# Patient Record
Sex: Female | Born: 1955 | Race: White | Hispanic: No | Smoking: Current some day smoker
Health system: Southern US, Community
[De-identification: ages and names within clinical notes are randomized; demographics above are authoritative.]

## PROBLEM LIST (undated history)

## (undated) DIAGNOSIS — E785 Hyperlipidemia, unspecified: Secondary | ICD-10-CM

## (undated) DIAGNOSIS — F32A Depression, unspecified: Secondary | ICD-10-CM

## (undated) DIAGNOSIS — F419 Anxiety disorder, unspecified: Secondary | ICD-10-CM

## (undated) DIAGNOSIS — F329 Major depressive disorder, single episode, unspecified: Secondary | ICD-10-CM

## (undated) DIAGNOSIS — G4700 Insomnia, unspecified: Secondary | ICD-10-CM

## (undated) DIAGNOSIS — R928 Other abnormal and inconclusive findings on diagnostic imaging of breast: Secondary | ICD-10-CM

## (undated) DIAGNOSIS — G5602 Carpal tunnel syndrome, left upper limb: Secondary | ICD-10-CM

## (undated) DIAGNOSIS — I10 Essential (primary) hypertension: Secondary | ICD-10-CM

## (undated) DIAGNOSIS — Z720 Tobacco use: Secondary | ICD-10-CM

## (undated) HISTORY — DX: Hyperlipidemia, unspecified: E78.5

## (undated) HISTORY — DX: Tobacco use: Z72.0

## (undated) HISTORY — DX: Carpal tunnel syndrome, left upper limb: G56.02

## (undated) HISTORY — DX: Major depressive disorder, single episode, unspecified: F32.9

## (undated) HISTORY — DX: Anxiety disorder, unspecified: F41.9

## (undated) HISTORY — DX: Other abnormal and inconclusive findings on diagnostic imaging of breast: R92.8

## (undated) HISTORY — DX: Insomnia, unspecified: G47.00

## (undated) HISTORY — PX: ABDOMINAL HYSTERECTOMY: SHX81

## (undated) HISTORY — DX: Depression, unspecified: F32.A

## (undated) HISTORY — DX: Essential (primary) hypertension: I10

---

## 2000-02-06 ENCOUNTER — Other Ambulatory Visit: Admission: RE | Admit: 2000-02-06 | Discharge: 2000-02-06 | Payer: Self-pay | Admitting: Obstetrics and Gynecology

## 2000-02-06 ENCOUNTER — Encounter (INDEPENDENT_AMBULATORY_CARE_PROVIDER_SITE_OTHER): Payer: Self-pay | Admitting: Specialist

## 2000-02-10 ENCOUNTER — Ambulatory Visit (HOSPITAL_COMMUNITY): Admission: RE | Admit: 2000-02-10 | Discharge: 2000-02-10 | Payer: Self-pay | Admitting: Obstetrics and Gynecology

## 2000-02-10 ENCOUNTER — Encounter: Payer: Self-pay | Admitting: Obstetrics and Gynecology

## 2000-03-06 ENCOUNTER — Ambulatory Visit: Admission: RE | Admit: 2000-03-06 | Discharge: 2000-03-06 | Payer: Self-pay | Admitting: Gynecology

## 2000-10-09 ENCOUNTER — Encounter: Admission: RE | Admit: 2000-10-09 | Discharge: 2000-10-09 | Payer: Self-pay | Admitting: Obstetrics & Gynecology

## 2000-10-11 ENCOUNTER — Encounter: Admission: RE | Admit: 2000-10-11 | Discharge: 2000-10-11 | Payer: Self-pay | Admitting: Internal Medicine

## 2000-10-18 ENCOUNTER — Encounter: Admission: RE | Admit: 2000-10-18 | Discharge: 2000-10-18 | Payer: Self-pay | Admitting: Internal Medicine

## 2000-11-15 ENCOUNTER — Encounter: Admission: RE | Admit: 2000-11-15 | Discharge: 2000-11-15 | Payer: Self-pay | Admitting: Obstetrics

## 2001-02-19 ENCOUNTER — Encounter (INDEPENDENT_AMBULATORY_CARE_PROVIDER_SITE_OTHER): Payer: Self-pay | Admitting: Specialist

## 2001-02-19 ENCOUNTER — Encounter: Admission: RE | Admit: 2001-02-19 | Discharge: 2001-02-19 | Payer: Self-pay | Admitting: Obstetrics & Gynecology

## 2001-02-27 ENCOUNTER — Ambulatory Visit (HOSPITAL_COMMUNITY): Admission: RE | Admit: 2001-02-27 | Discharge: 2001-02-27 | Payer: Self-pay

## 2001-03-04 ENCOUNTER — Encounter: Admission: RE | Admit: 2001-03-04 | Discharge: 2001-03-04 | Payer: Self-pay | Admitting: Internal Medicine

## 2001-03-29 ENCOUNTER — Encounter: Admission: RE | Admit: 2001-03-29 | Discharge: 2001-03-29 | Payer: Self-pay | Admitting: Obstetrics & Gynecology

## 2001-04-09 ENCOUNTER — Encounter: Admission: RE | Admit: 2001-04-09 | Discharge: 2001-04-09 | Payer: Self-pay | Admitting: Obstetrics & Gynecology

## 2001-04-09 ENCOUNTER — Other Ambulatory Visit: Admission: RE | Admit: 2001-04-09 | Discharge: 2001-04-09 | Payer: Self-pay | Admitting: *Deleted

## 2001-06-05 HISTORY — PX: OTHER SURGICAL HISTORY: SHX169

## 2001-07-30 ENCOUNTER — Encounter: Admission: RE | Admit: 2001-07-30 | Discharge: 2001-07-30 | Payer: Self-pay | Admitting: Internal Medicine

## 2001-08-13 ENCOUNTER — Encounter: Admission: RE | Admit: 2001-08-13 | Discharge: 2001-08-13 | Payer: Self-pay | Admitting: *Deleted

## 2001-08-29 ENCOUNTER — Encounter (INDEPENDENT_AMBULATORY_CARE_PROVIDER_SITE_OTHER): Payer: Self-pay

## 2001-08-29 ENCOUNTER — Inpatient Hospital Stay (HOSPITAL_COMMUNITY): Admission: RE | Admit: 2001-08-29 | Discharge: 2001-09-01 | Payer: Self-pay | Admitting: Obstetrics and Gynecology

## 2001-08-29 ENCOUNTER — Encounter (INDEPENDENT_AMBULATORY_CARE_PROVIDER_SITE_OTHER): Payer: Self-pay | Admitting: Specialist

## 2001-09-05 ENCOUNTER — Encounter: Admission: RE | Admit: 2001-09-05 | Discharge: 2001-09-05 | Payer: Self-pay | Admitting: *Deleted

## 2001-10-08 ENCOUNTER — Encounter: Admission: RE | Admit: 2001-10-08 | Discharge: 2001-10-08 | Payer: Self-pay | Admitting: *Deleted

## 2001-11-25 ENCOUNTER — Encounter: Admission: RE | Admit: 2001-11-25 | Discharge: 2001-11-25 | Payer: Self-pay | Admitting: Internal Medicine

## 2001-12-23 ENCOUNTER — Encounter: Admission: RE | Admit: 2001-12-23 | Discharge: 2001-12-23 | Payer: Self-pay | Admitting: Internal Medicine

## 2002-04-04 ENCOUNTER — Encounter: Admission: RE | Admit: 2002-04-04 | Discharge: 2002-04-04 | Payer: Self-pay | Admitting: Internal Medicine

## 2002-05-22 ENCOUNTER — Encounter: Admission: RE | Admit: 2002-05-22 | Discharge: 2002-05-22 | Payer: Self-pay | Admitting: Obstetrics and Gynecology

## 2002-06-26 ENCOUNTER — Encounter: Admission: RE | Admit: 2002-06-26 | Discharge: 2002-06-26 | Payer: Self-pay | Admitting: Internal Medicine

## 2003-06-25 ENCOUNTER — Encounter: Admission: RE | Admit: 2003-06-25 | Discharge: 2003-06-25 | Payer: Self-pay | Admitting: Obstetrics and Gynecology

## 2003-08-10 ENCOUNTER — Ambulatory Visit (HOSPITAL_COMMUNITY): Admission: RE | Admit: 2003-08-10 | Discharge: 2003-08-10 | Payer: Self-pay | Admitting: Obstetrics and Gynecology

## 2003-08-17 ENCOUNTER — Encounter: Admission: RE | Admit: 2003-08-17 | Discharge: 2003-08-17 | Payer: Self-pay | Admitting: Internal Medicine

## 2003-08-25 ENCOUNTER — Encounter: Admission: RE | Admit: 2003-08-25 | Discharge: 2003-08-25 | Payer: Self-pay | Admitting: Obstetrics and Gynecology

## 2003-08-25 ENCOUNTER — Encounter (INDEPENDENT_AMBULATORY_CARE_PROVIDER_SITE_OTHER): Payer: Self-pay | Admitting: Internal Medicine

## 2005-04-21 ENCOUNTER — Ambulatory Visit: Payer: Self-pay | Admitting: Internal Medicine

## 2006-04-24 ENCOUNTER — Ambulatory Visit: Payer: Self-pay | Admitting: Hospitalist

## 2006-04-24 ENCOUNTER — Encounter (INDEPENDENT_AMBULATORY_CARE_PROVIDER_SITE_OTHER): Payer: Self-pay | Admitting: Internal Medicine

## 2006-04-24 LAB — CONVERTED CEMR LAB
ALT: 20 units/L (ref 0–35)
AST: 16 units/L (ref 0–37)
BUN: 14 mg/dL (ref 6–23)
Cholesterol: 269 mg/dL — ABNORMAL HIGH (ref 0–200)
Glucose, Bld: 94 mg/dL (ref 70–99)
HCT: 43.6 % — ABNORMAL HIGH (ref 34.4–43.3)
Hemoglobin: 14.6 g/dL (ref 11.7–14.8)
MCHC: 33.5 g/dL (ref 33.1–35.4)
Platelets: 374 10*3/uL (ref 152–374)
RBC: 4.51 M/uL (ref 3.79–4.96)
Sodium: 138 meq/L (ref 135–145)
VLDL: 46 mg/dL — ABNORMAL HIGH (ref 0–40)
WBC: 7.8 10*3/uL (ref 3.7–10.0)

## 2006-05-07 DIAGNOSIS — F411 Generalized anxiety disorder: Secondary | ICD-10-CM | POA: Insufficient documentation

## 2006-05-07 DIAGNOSIS — F418 Other specified anxiety disorders: Secondary | ICD-10-CM | POA: Insufficient documentation

## 2006-05-07 DIAGNOSIS — Z720 Tobacco use: Secondary | ICD-10-CM | POA: Insufficient documentation

## 2006-05-07 DIAGNOSIS — G4700 Insomnia, unspecified: Secondary | ICD-10-CM | POA: Insufficient documentation

## 2006-05-07 DIAGNOSIS — M545 Low back pain, unspecified: Secondary | ICD-10-CM | POA: Insufficient documentation

## 2006-05-07 DIAGNOSIS — Z9079 Acquired absence of other genital organ(s): Secondary | ICD-10-CM | POA: Insufficient documentation

## 2006-05-07 DIAGNOSIS — I10 Essential (primary) hypertension: Secondary | ICD-10-CM | POA: Insufficient documentation

## 2006-05-25 ENCOUNTER — Encounter (INDEPENDENT_AMBULATORY_CARE_PROVIDER_SITE_OTHER): Payer: Self-pay | Admitting: Internal Medicine

## 2006-10-15 ENCOUNTER — Telehealth: Payer: Self-pay | Admitting: *Deleted

## 2006-11-16 ENCOUNTER — Telehealth: Payer: Self-pay | Admitting: *Deleted

## 2007-01-22 ENCOUNTER — Telehealth: Payer: Self-pay | Admitting: *Deleted

## 2007-02-18 ENCOUNTER — Ambulatory Visit: Payer: Self-pay | Admitting: Internal Medicine

## 2007-02-18 ENCOUNTER — Encounter (INDEPENDENT_AMBULATORY_CARE_PROVIDER_SITE_OTHER): Payer: Self-pay | Admitting: Internal Medicine

## 2007-02-19 LAB — CONVERTED CEMR LAB
ALT: 21 units/L (ref 0–35)
AST: 17 units/L (ref 0–37)
Albumin: 4.8 g/dL (ref 3.5–5.2)
Alkaline Phosphatase: 87 units/L (ref 39–117)
BUN: 15 mg/dL (ref 6–23)
Calcium: 9.9 mg/dL (ref 8.4–10.5)
Chloride: 101 meq/L (ref 96–112)
Cholesterol: 235 mg/dL — ABNORMAL HIGH (ref 0–200)
HDL: 40 mg/dL (ref >39)
LDL Cholesterol: 149 mg/dL — ABNORMAL HIGH (ref 0–99)
Potassium: 4.3 meq/L (ref 3.5–5.3)
Total Bilirubin: 0.4 mg/dL (ref 0.3–1.2)
Total CHOL/HDL Ratio: 5.9
Total Protein: 7.5 g/dL (ref 6.0–8.3)
Triglycerides: 232 mg/dL — ABNORMAL HIGH (ref <150)
VLDL: 46 mg/dL — ABNORMAL HIGH (ref 0–40)

## 2007-06-24 ENCOUNTER — Telehealth (INDEPENDENT_AMBULATORY_CARE_PROVIDER_SITE_OTHER): Payer: Self-pay | Admitting: Internal Medicine

## 2007-08-26 ENCOUNTER — Telehealth (INDEPENDENT_AMBULATORY_CARE_PROVIDER_SITE_OTHER): Payer: Self-pay | Admitting: Internal Medicine

## 2007-11-05 ENCOUNTER — Telehealth (INDEPENDENT_AMBULATORY_CARE_PROVIDER_SITE_OTHER): Payer: Self-pay | Admitting: Internal Medicine

## 2007-11-11 ENCOUNTER — Telehealth (INDEPENDENT_AMBULATORY_CARE_PROVIDER_SITE_OTHER): Payer: Self-pay | Admitting: Internal Medicine

## 2008-02-04 ENCOUNTER — Telehealth (INDEPENDENT_AMBULATORY_CARE_PROVIDER_SITE_OTHER): Payer: Self-pay | Admitting: Internal Medicine

## 2008-06-23 ENCOUNTER — Ambulatory Visit: Payer: Self-pay | Admitting: Infectious Disease

## 2008-06-23 ENCOUNTER — Encounter: Payer: Self-pay | Admitting: Internal Medicine

## 2008-06-23 ENCOUNTER — Encounter (INDEPENDENT_AMBULATORY_CARE_PROVIDER_SITE_OTHER): Payer: Self-pay | Admitting: Internal Medicine

## 2008-06-23 DIAGNOSIS — H6690 Otitis media, unspecified, unspecified ear: Secondary | ICD-10-CM | POA: Insufficient documentation

## 2008-06-23 DIAGNOSIS — E785 Hyperlipidemia, unspecified: Secondary | ICD-10-CM

## 2008-06-23 DIAGNOSIS — H60339 Swimmer's ear, unspecified ear: Secondary | ICD-10-CM | POA: Insufficient documentation

## 2008-06-23 HISTORY — DX: Hyperlipidemia, unspecified: E78.5

## 2008-06-27 DIAGNOSIS — R7401 Elevation of levels of liver transaminase levels: Secondary | ICD-10-CM | POA: Insufficient documentation

## 2008-06-27 DIAGNOSIS — R740 Nonspecific elevation of levels of transaminase and lactic acid dehydrogenase [LDH]: Secondary | ICD-10-CM

## 2008-06-27 LAB — CONVERTED CEMR LAB
Albumin: 4.4 g/dL (ref 3.5–5.2)
Calcium: 10.3 mg/dL (ref 8.4–10.5)
Chloride: 101 meq/L (ref 96–112)
Total Bilirubin: 0.3 mg/dL (ref 0.3–1.2)
Total CHOL/HDL Ratio: 7.5

## 2008-07-02 ENCOUNTER — Ambulatory Visit: Payer: Self-pay | Admitting: Internal Medicine

## 2009-01-12 ENCOUNTER — Telehealth (INDEPENDENT_AMBULATORY_CARE_PROVIDER_SITE_OTHER): Payer: Self-pay | Admitting: *Deleted

## 2009-03-15 ENCOUNTER — Ambulatory Visit: Payer: Self-pay | Admitting: Internal Medicine

## 2009-03-22 ENCOUNTER — Telehealth: Payer: Self-pay | Admitting: Internal Medicine

## 2009-03-22 LAB — CONVERTED CEMR LAB
ALT: 53 units/L — ABNORMAL HIGH (ref 0–35)
AST: 35 units/L (ref 0–37)
Albumin: 5 g/dL (ref 3.5–5.2)
Alkaline Phosphatase: 113 units/L (ref 39–117)
BUN: 13 mg/dL (ref 6–23)
Calcium: 10.4 mg/dL (ref 8.4–10.5)
Cholesterol: 253 mg/dL — ABNORMAL HIGH (ref 0–200)
Creatinine, Ser: 0.84 mg/dL (ref 0.40–1.20)
Sodium: 139 meq/L (ref 135–145)
Total Protein: 8 g/dL (ref 6.0–8.3)

## 2009-04-28 ENCOUNTER — Telehealth: Payer: Self-pay | Admitting: Internal Medicine

## 2009-04-28 ENCOUNTER — Ambulatory Visit (HOSPITAL_COMMUNITY): Admission: RE | Admit: 2009-04-28 | Discharge: 2009-04-28 | Payer: Self-pay | Admitting: Internal Medicine

## 2009-04-28 ENCOUNTER — Ambulatory Visit: Payer: Self-pay | Admitting: Infectious Disease

## 2009-05-03 LAB — CONVERTED CEMR LAB
Cholesterol: 272 mg/dL — ABNORMAL HIGH (ref 0–200)
HCV Ab: NEGATIVE
HDL: 37 mg/dL — ABNORMAL LOW (ref >39)
Hep A Total Ab: NEGATIVE
Total CHOL/HDL Ratio: 7.4

## 2009-05-05 DIAGNOSIS — R928 Other abnormal and inconclusive findings on diagnostic imaging of breast: Secondary | ICD-10-CM

## 2009-05-05 HISTORY — DX: Other abnormal and inconclusive findings on diagnostic imaging of breast: R92.8

## 2009-05-06 DIAGNOSIS — R928 Other abnormal and inconclusive findings on diagnostic imaging of breast: Secondary | ICD-10-CM | POA: Insufficient documentation

## 2009-05-12 ENCOUNTER — Encounter: Admission: RE | Admit: 2009-05-12 | Discharge: 2009-05-12 | Payer: Self-pay | Admitting: Internal Medicine

## 2009-07-27 ENCOUNTER — Telehealth: Payer: Self-pay | Admitting: Internal Medicine

## 2009-08-25 ENCOUNTER — Telehealth: Payer: Self-pay | Admitting: Internal Medicine

## 2009-11-02 ENCOUNTER — Telehealth: Payer: Self-pay | Admitting: Internal Medicine

## 2009-11-03 ENCOUNTER — Telehealth: Payer: Self-pay | Admitting: Internal Medicine

## 2009-11-17 ENCOUNTER — Ambulatory Visit: Payer: Self-pay | Admitting: Internal Medicine

## 2009-12-08 ENCOUNTER — Telehealth: Payer: Self-pay | Admitting: Internal Medicine

## 2010-06-02 ENCOUNTER — Telehealth: Payer: Self-pay | Admitting: Internal Medicine

## 2010-06-25 ENCOUNTER — Encounter: Payer: Self-pay | Admitting: *Deleted

## 2010-07-05 NOTE — Progress Notes (Signed)
Summary: refill/gg  Phone Note Refill Request  on December 08, 2009 10:51 AM  Refills Requested: Medication #1:  HYDROCHLOROTHIAZIDE 25 MG TABS Take 1 tablet by mouth once a day   Last Refilled: 11/03/2009  Method Requested: Electronic Initial call taken by: Merrie Roof RN,  December 08, 2009 10:51 AM  Follow-up for Phone Call        Rx faxed to pharmacy Follow-up by: Mariea Stable MD,  December 08, 2009 12:34 PM    Prescriptions: HYDROCHLOROTHIAZIDE 25 MG TABS (HYDROCHLOROTHIAZIDE) Take 1 tablet by mouth once a day  #30 x 6   Entered and Authorized by:   Mariea Stable MD   Signed by:   Mariea Stable MD on 12/08/2009   Method used:   Electronically to        [facility] [id number] (retail)       [address]       [place], [place]  [address]       Ph: [phone]       Fax: [phone]   RxID:   [id number]

## 2010-07-05 NOTE — Progress Notes (Signed)
Summary: refill/gg  Phone Note Refill Request  on August 25, 2009 11:49 AM  Refills Requested: Medication #1:  FLUOXETINE HCL 20 MG TABS Take 1 tablet by mouth once a day   Last Refilled: 07/23/2009  Method Requested: Electronic Initial call taken by: Merrie Roof RN,  August 25, 2009 11:49 AM  Follow-up for Phone Call        Rx faxed to pharmacy Follow-up by: Mariea Stable MD,  August 25, 2009 4:54 PM    Prescriptions: FLUOXETINE HCL 20 MG TABS (FLUOXETINE HCL) Take 1 tablet by mouth once a day  #30 x 3   Entered and Authorized by:   Mariea Stable MD   Signed by:   Mariea Stable MD on 08/25/2009   Method used:   Electronically to        [facility] [id number] (retail)       [address]       [place], [place]  [address]       Ph: [phone]       Fax: [phone]   RxID:   [id number]

## 2010-07-05 NOTE — Progress Notes (Signed)
Summary: refill/ hla  Phone Note Refill Request Message from:  Fax from Pharmacy on November 03, 2009 11:43 AM  Refills Requested: Medication #1:  HYDROCHLOROTHIAZIDE 25 MG TABS Take 1 tablet by mouth once a day   Dosage confirmed as above?Dosage Confirmed pt has appt 6/15  Initial call taken by: Marin Roberts RN,  November 03, 2009 11:43 AM  Follow-up for Phone Call        Rx faxed to pharmacy Follow-up by: Mariea Stable MD,  November 03, 2009 12:35 PM    Prescriptions: HYDROCHLOROTHIAZIDE 25 MG TABS (HYDROCHLOROTHIAZIDE) Take 1 tablet by mouth once a day  #30 x 0   Entered and Authorized by:   Mariea Stable MD   Signed by:   Mariea Stable MD on 11/03/2009   Method used:   Electronically to        [facility] [id number] (retail)       [address]       [place], [place]  [address]       Ph: [phone]       Fax: [phone]   RxID:   [id number]

## 2010-07-05 NOTE — Progress Notes (Signed)
Summary: med refill/gp  Phone Note Refill Request Message from:  Fax from Pharmacy on July 27, 2009 3:32 PM  Refills Requested: Medication #1:  PRAVACHOL 40 MG TABS Take 1 tab by mouth at bedtime   Last Refilled: 06/20/2009  Medication #2:  FENOFIBRATE 54 MG TABS Take 1 tablet by mouth once a day.   Last Refilled: 06/20/2009  Method Requested: Electronic Initial call taken by: Chinita Pester RN,  July 27, 2009 3:32 PM  Follow-up for Phone Call        Pt should have appointment coming up soon to f/u on lipids and LFTs  Rx faxed to pharmacy Follow-up by: Mariea Stable MD,  July 28, 2009 9:02 AM    Prescriptions: FENOFIBRATE 54 MG TABS (FENOFIBRATE) Take 1 tablet by mouth once a day  #30 x 2   Entered and Authorized by:   Mariea Stable MD   Signed by:   Mariea Stable MD on 07/28/2009   Method used:   Electronically to        [facility] [id number] (retail)       [address]       [place], [place]  [address]       Ph: [phone]       Fax: [phone]   RxID:   [id number] PRAVACHOL 40 MG TABS (PRAVASTATIN SODIUM) Take 1 tab by mouth at bedtime  #30 x prn   Entered and Authorized by:   Mariea Stable MD   Signed by:   Mariea Stable MD on 07/28/2009   Method used:   Electronically to        [facility] [id number] (retail)       [address]       [place], [place]  [address]       Ph: [phone]       Fax: [phone]   RxID:   [id number]   Appended Document: med refill/gp This patient has been given an appointment and mailed an appointment for 09/02/2009 with Dr. Onalee Hua.

## 2010-07-05 NOTE — Assessment & Plan Note (Signed)
Summary: est-ck/fu/meds/cfb   Vital Signs:  Patient profile:   55 year old female Height:      61.5 inches (156.21 cm) Weight:      165.7 pounds (76.82 kg) BMI:     31.53 Temp:     97.4 degrees F (36.33 degrees C) oral Pulse rate:   49 / minute BP sitting:   150 / 87  (left arm) Cuff size:   regular  Vitals Entered By: Theotis Barrio NT II (November 17, 2009 8:44 AM) CC: ROUTINE OFFICE VISIT WITH MEDICATION REFILL  /  NO CONCERNS NOR COMPLAINTS Is Patient Diabetic? No Pain Assessment Patient in pain? no      Nutritional Status BMI of 25 - 29 = overweight  Have you ever been in a relationship where you felt threatened, hurt or afraid?No   Does patient need assistance? Functional Status Self care Comments ROUTINE OFFICE VISIT WITH MEDICATION REFILL  /  NOR CONCERNS NOR COMPLAINTS   Primary Care Provider:  Carlus Pavlov MD  CC:  ROUTINE OFFICE VISIT WITH MEDICATION REFILL  /  NO CONCERNS NOR COMPLAINTS.  History of Present Illness: Kelsey Hudson is a 55 yo woman with PMH as outlined below.  She is here for routine visit.  She has no complaints aside from large medical bills recieved from prior visits and labs ordered.  She remains uninsured and had a card from [facility], but she is living with a friend and with his income added she does not qualify anymore.      Preventive Screening-Counseling & Management  Alcohol-Tobacco     Smoking Status: current     Smoking Cessation Counseling: yes     Packs/Day: 1     Year Started: for 30+ years  Caffeine-Diet-Exercise     Does Patient Exercise: no  Current Medications (verified): 1)  Atenolol 25 Mg Tabs (Atenolol) .... Take 1 Tablet By Mouth Once A Day 2)  Hydrochlorothiazide 25 Mg Tabs (Hydrochlorothiazide) .... Take 1 Tablet By Mouth Once A Day 3)  Calcium-Vitamin D 500-200 Mg-Unit Tabs (Calcium Carbonate-Vitamin D) .... Take 1 Tablet By Mouth Once A Day 4)  Pravachol 40 Mg Tabs (Pravastatin Sodium) .... Take 1 Tab By Mouth  At Bedtime 5)  Fluoxetine Hcl 20 Mg Tabs (Fluoxetine Hcl) .... Take 1 Tablet By Mouth Once A Day 6)  Fenofibrate 54 Mg Tabs (Fenofibrate) .... Take 1 Tablet By Mouth Once A Day  Allergies (verified): No Known Drug Allergies  Past History:  Past Medical History: Last updated: 05/07/2006 Current Problems:  HYPERTENSION (ICD-401.9) HYPERTRIGLYCERIDEMIA (ICD-272.1) DEPRESSION (ICD-311) ANXIETY (ICD-300.00) INSOMNIA (ICD-780.52) TOBACCO ABUSE (ICD-305.1) HYSTERECTOMY, HX OF (ICD-V45.77) LOW BACK PAIN (ICD-724.2)  Past Surgical History: Last updated: 05/07/2006 Hysterectomy- 2003  Risk Factors: Smoking Status: current (11/17/2009) Packs/Day: 1 (11/17/2009)  Social History: Does Patient Exercise:  no  Review of Systems      See HPI  Physical Exam  General:  alert, well-nourished, and overweight-appearing.   Eyes:  anicteric Lungs:  normal respiratory effort, no accessory muscle use, normal breath sounds, no crackles, and no wheezes.   Heart:  normal rate, regular rhythm, no murmur, no gallop, and no rub.   Abdomen:  normal bowel sounds.   Extremities:  no edema Neurologic:  alert & oriented X3, cranial nerves II-XII intact, strength normal in all extremities, and gait normal.   Psych:  Oriented X3, memory intact for recent and remote, normally interactive, good eye contact, not anxious appearing, and not depressed appearing.  Impression & Recommendations:  Problem # 1:  OTHER ABNORMAL FINDING RADIOLOGICAL EXAM BREAST (ICD-793.89) compression views:  prob benign calcifications.....Marland Kitchen6 mo f/u recommended...Marland Kitchennow due but pt refuses due to cost....discussed and understands implications of not getting it done.  Problem # 2:  TRANSAMINASES, SERUM, ELEVATED (ICD-790.4) Likely fatty infiltration...Marland KitchenMarland Kitchenrefuses further w/u at this time due to outstanding bills  Problem # 3:  HYPERTENSION (ICD-401.9)  has been on atenolol for long time....Marland Kitchennot sure why it was chosen...Marland KitchenMarland KitchenHR 49  today and BP slightly elevated will d/c atenolol and start amlodipine 5mg  ($10/100 at [facility])....this will not require monitoring of K and Cr  The following medications were removed from the medication list:    Atenolol 25 Mg Tabs (Atenolol) .Marland Kitchen... Take 1 tablet by mouth once a day Her updated medication list for this problem includes:    Hydrochlorothiazide 25 Mg Tabs (Hydrochlorothiazide) .Marland Kitchen... Take 1 tablet by mouth once a day    Amlodipine Besylate 5 Mg Tabs (Amlodipine besylate) .Marland Kitchen... Take 1 tablet by mouth once a day  BP today: 150/87 Prior BP: 155/84 (03/15/2009)  Labs Reviewed: K+: 4.4 (03/15/2009) Creat: : 0.84 (03/15/2009)   Chol: 272 (04/28/2009)   HDL: 37 (04/28/2009)   LDL: * mg/dL (16/03/9603)   TG: 540 (04/28/2009)  Problem # 4:  HYPERLIPIDEMIA (ICD-272.4) started fenofibrate last visit pt refuses further tests due to outstanding balance.  Her updated medication list for this problem includes:    Pravachol 40 Mg Tabs (Pravastatin sodium) .Marland Kitchen... Take 1 tab by mouth at bedtime    Fenofibrate 54 Mg Tabs (Fenofibrate) .Marland Kitchen... Take 1 tablet by mouth once a day  Labs Reviewed: SGOT: 35 (03/15/2009)   SGPT: 53 (03/15/2009)   HDL:37 (04/28/2009), 36 (03/15/2009)  LDL:* mg/dL (98/04/9146), NOT CALC mg/dL (82/95/6213)  YQMV:784 (04/28/2009), 253 (03/15/2009)  Trig:479 (04/28/2009), 487 (03/15/2009)  Problem # 5:  DEPRESSION (ICD-311) She does not feel much different Is sleeping better Reports that her boyfriend has definitely noticed a positive change will increase to 40mg   Her updated medication list for this problem includes:    Fluoxetine Hcl 40 Mg Caps (Fluoxetine hcl) .Marland Kitchen... Take 1 tablet by mouth once a day  Complete Medication List: 1)  Hydrochlorothiazide 25 Mg Tabs (Hydrochlorothiazide) .... Take 1 tablet by mouth once a day 2)  Calcium-vitamin D 500-200 Mg-unit Tabs (Calcium carbonate-vitamin d) .... Take 1 tablet by mouth once a day 3)  Pravachol 40 Mg Tabs  (Pravastatin sodium) .... Take 1 tab by mouth at bedtime 4)  Fluoxetine Hcl 40 Mg Caps (Fluoxetine hcl) .... Take 1 tablet by mouth once a day 5)  Fenofibrate 54 Mg Tabs (Fenofibrate) .... Take 1 tablet by mouth once a day 6)  Amlodipine Besylate 5 Mg Tabs (Amlodipine besylate) .... Take 1 tablet by mouth once a day  Patient Instructions: 1)  Please schedule a follow-up appointment in 3 months. 2)  See Jaynee Eagles to discuss bills. 3)  Will increase fluoxetine to 40mg  (take 2 of the 20mg  tablets until finished), then pick up new prescription ($4). 4)  Will stop atenolol 5)  Start amlodipine 5mg  by mouth daily, it is $10 for 100 pills (3 month supply) at [facility] [phone] 6)  If you have any problems before your next visit, call clinic.  Prescriptions: AMLODIPINE BESYLATE 5 MG TABS (AMLODIPINE BESYLATE) Take 1 tablet by mouth once a day  #100 x 3   Entered and Authorized by:   Mariea Stable MD   Signed by:   Mariea Stable MD  on 11/17/2009   Method used:   Electronically to        [facility]* (retail)       [address]       [place], [place]  [address]       Ph: [phone]       Fax: [phone]   RxID:   [id number] FLUOXETINE HCL 40 MG CAPS (FLUOXETINE HCL) Take 1 tablet by mouth once a day  #30 x 3   Entered and Authorized by:   Mariea Stable MD   Signed by:   Mariea Stable MD on 11/17/2009   Method used:   Electronically to        [facility] [id number] (retail)       [address]       [place], [place]  [address]       Ph: [phone]       Fax: [phone]   RxID:   [id number]    Prevention & Chronic Care Immunizations   Influenza vaccine: Not documented   Influenza vaccine deferral: Refused  (03/15/2009)    Tetanus booster: Not documented   Td booster deferral: Refused  (03/15/2009)    Pneumococcal vaccine: Not documented  Colorectal Screening   Hemoccult: Not documented   Hemoccult action/deferral:  Refused  (03/15/2009)    Colonoscopy: Not documented   Colonoscopy action/deferral: Refused  (03/15/2009)  Other Screening   Pap smear: Not documented   Pap smear action/deferral: Not indicated S/P hysterectomy  (03/15/2009)    Mammogram: BI-RADS CATEGORY 3:  Probably benign finding(s) - short interval^MM DIGITAL DIAG LTD R  (05/12/2009)   Mammogram action/deferral: Ordered  (03/15/2009)   Smoking status: current  (11/17/2009)   Smoking cessation counseling: yes  (11/17/2009)  Lipids   Total Cholesterol: 272  (04/28/2009)   Lipid panel action/deferral: Lipid Panel ordered   LDL: * mg/dL  (55/73/2202)   LDL Direct: Not documented   HDL: 37  (04/28/2009)   Triglycerides: 479  (04/28/2009)    SGOT (AST): 35  (03/15/2009)   BMP action: Ordered   SGPT (ALT): 53  (03/15/2009)   Alkaline phosphatase: 113  (03/15/2009)   Total bilirubin: 0.5  (03/15/2009)    Lipid flowsheet reviewed?: Yes   Progress toward LDL goal: Unchanged   Lipid comments: pt refuses checking labs due to cost and outstanding bills  Hypertension   Last Blood Pressure: 150 / 87  (11/17/2009)   Serum creatinine: 0.84  (03/15/2009)   BMP action: Ordered   Serum potassium 4.4  (03/15/2009)    Hypertension flowsheet reviewed?: Yes   Progress toward BP goal: Unchanged  Self-Management Support :   Personal Goals (by the next clinic visit) :      Personal blood pressure goal: 140/90  (11/17/2009)     Personal LDL goal: 100  (11/17/2009)    Patient will work on the following items until the next clinic visit to reach self-care goals:     Medications and monitoring: take my medicines every day, bring all of my medications to every visit  (11/17/2009)     Eating: drink diet soda or water instead of juice or soda, eat more vegetables, use fresh or frozen vegetables, eat foods that are low in salt, eat baked foods instead of fried foods, eat fruit for snacks and desserts, limit or avoid alcohol  (11/17/2009)      Activity: take the stairs instead of the elevator, park at the far end of the  parking lot  (03/15/2009)    Hypertension self-management support: Resources for patients handout, Written self-care plan  (11/17/2009)   Hypertension self-care plan printed.    Lipid self-management support: Resources for patients handout, Written self-care plan  (11/17/2009)   Lipid self-care plan printed.    Self-management comments: WORK OUT IN THE YARD      Resource handout printed.

## 2010-07-05 NOTE — Progress Notes (Signed)
Summary: med refill/gp  Phone Note Refill Request Message from:  Fax from Pharmacy on Nov 02, 2009 2:16 PM  Refills Requested: Medication #1:  ATENOLOL 25 MG TABS Take 1 tablet by mouth once a day   Last Refilled: 09/28/2009 Last appt.Nov 2010.   Method Requested: Electronic Initial call taken by: Chinita Pester RN,  Nov 02, 2009 2:16 PM  Follow-up for Phone Call        Rx faxed to pharmacy Follow-up by: Mariea Stable MD,  November 03, 2009 8:04 AM    Prescriptions: ATENOLOL 25 MG TABS (ATENOLOL) Take 1 tablet by mouth once a day  #30 x 6   Entered and Authorized by:   Mariea Stable MD   Signed by:   Mariea Stable MD on 11/03/2009   Method used:   Electronically to        [facility] [id number] (retail)       [address]       [place], [place]  [address]       Ph: [phone]       Fax: [phone]   RxID:   [id number]

## 2010-07-07 NOTE — Progress Notes (Signed)
Summary: Refill/gh  Phone Note Refill Request Message from:  Fax from Pharmacy on June 02, 2010 10:03 AM  Refills Requested: Medication #1:  FENOFIBRATE 54 MG TABS Take 1 tablet by mouth once a day   Last Refilled: 04/26/2010 Last office visit was 11/17/2009.  Last labs were 04/28/2010.   Method Requested: Electronic Initial call taken by: Angelina Ok RN,  June 02, 2010 10:04 AM  Follow-up for Phone Call        Refill approved-nurse to complete Follow-up by: Julaine Fusi  DO,  June 02, 2010 1:56 PM    Prescriptions: FENOFIBRATE 54 MG TABS (FENOFIBRATE) Take 1 tablet by mouth once a day  #30 x 2   Entered and Authorized by:   Julaine Fusi  DO   Signed by:   Julaine Fusi  DO on 06/02/2010   Method used:   Electronically to        [facility] [id number] (retail)       [address]       [place], [place]  [address]       Ph: [phone]       Fax: [phone]   RxID:   [id number]

## 2010-08-23 ENCOUNTER — Other Ambulatory Visit: Payer: Self-pay | Admitting: *Deleted

## 2010-08-23 MED ORDER — HYDROCHLOROTHIAZIDE 25 MG PO TABS: 25.0000 mg | ORAL_TABLET | Freq: Every day | ORAL | Status: DC

## 2010-08-23 NOTE — Telephone Encounter (Signed)
Patient last seen in June 2011 but no recent labs.  Meds refilled but should have follow up with labs specified on reason for office visit.  Will route to front desk.

## 2010-09-13 ENCOUNTER — Encounter: Payer: Self-pay | Admitting: Internal Medicine

## 2010-09-13 ENCOUNTER — Ambulatory Visit (INDEPENDENT_AMBULATORY_CARE_PROVIDER_SITE_OTHER): Payer: Self-pay | Admitting: Internal Medicine

## 2010-09-13 VITALS — BP 157/87 | HR 66 | Temp 97.9°F | Ht 62.0 in | Wt 156.1 lb

## 2010-09-13 DIAGNOSIS — G5602 Carpal tunnel syndrome, left upper limb: Secondary | ICD-10-CM

## 2010-09-13 DIAGNOSIS — Z23 Encounter for immunization: Secondary | ICD-10-CM

## 2010-09-13 DIAGNOSIS — G5600 Carpal tunnel syndrome, unspecified upper limb: Secondary | ICD-10-CM

## 2010-09-13 DIAGNOSIS — F17200 Nicotine dependence, unspecified, uncomplicated: Secondary | ICD-10-CM

## 2010-09-13 DIAGNOSIS — E785 Hyperlipidemia, unspecified: Secondary | ICD-10-CM

## 2010-09-13 DIAGNOSIS — I10 Essential (primary) hypertension: Secondary | ICD-10-CM

## 2010-09-13 DIAGNOSIS — F329 Major depressive disorder, single episode, unspecified: Secondary | ICD-10-CM

## 2010-09-13 HISTORY — DX: Carpal tunnel syndrome, left upper limb: G56.02

## 2010-09-13 LAB — BASIC METABOLIC PANEL
CO2: 26 mEq/L (ref 19–32)
Chloride: 101 mEq/L (ref 96–112)
Glucose, Bld: 97 mg/dL (ref 70–99)
Potassium: 4 mEq/L (ref 3.5–5.3)
Sodium: 140 mEq/L (ref 135–145)

## 2010-09-13 MED ORDER — PRAVASTATIN SODIUM 40 MG PO TABS: 40.0000 mg | ORAL_TABLET | Freq: Every day | ORAL | Status: DC

## 2010-09-13 MED ORDER — HYDROCHLOROTHIAZIDE 25 MG PO TABS: 25.0000 mg | ORAL_TABLET | Freq: Every day | ORAL | Status: DC

## 2010-09-13 MED ORDER — AMLODIPINE BESYLATE 10 MG PO TABS: 10.0000 mg | ORAL_TABLET | Freq: Every day | ORAL | Status: DC

## 2010-09-13 MED ORDER — AMLODIPINE BESYLATE 5 MG PO TABS: 5.0000 mg | ORAL_TABLET | Freq: Every day | ORAL | Status: DC

## 2010-09-13 MED ORDER — FENOFIBRATE 54 MG PO TABS: 54.0000 mg | ORAL_TABLET | Freq: Every day | ORAL | Status: DC

## 2010-09-13 MED ORDER — CALCIUM CARBONATE-VITAMIN D 500-200 MG-UNIT PO TABS: 1.0000 | ORAL_TABLET | Freq: Every day | ORAL | Status: DC

## 2010-09-13 NOTE — Assessment & Plan Note (Signed)
Patient was given an appointment with social work for counseling purposes. Patient understands that smoking is the biggest risk factor right now for having heart problems. I spent 10-15 minutes in counseling about the ways and benefits of stopping smoking. Patient is not agreeable to try any new therapies at this time and wants to quit by herself.

## 2010-09-13 NOTE — Assessment & Plan Note (Signed)
As above.

## 2010-09-13 NOTE — Assessment & Plan Note (Signed)
I increased patient's Norvasc from 5 mg a day to 10 mg a day. Have asked the patient to cut back on her salt intake and also try and lose weight which would help her blood pressure as well as her carpal tunnel syndrome. I have asked her to come back to the clinic in 6 months with a diary of regular blood pressure readings.

## 2010-09-13 NOTE — Assessment & Plan Note (Signed)
Patient stopped taking fluoxetine by herself and states that it was not helping her. I asked her about her anxiety issues and she did not seem concerned at this point. I discussed the fact that it takes at least 1-2 months for the therapy to take effect. Patient is not interested in starting any new medications for anxiety. If this comes up as an issue in the future I would recommend starting an SSRI with a trial of at least 4-6 months before stopping it.

## 2010-09-13 NOTE — Assessment & Plan Note (Signed)
Patient was started on fenofibrate recently. I do not see a need to check another lipid profile today. Patient will continue on fenofibrate and Pravachol. Refills were provided today

## 2010-09-13 NOTE — Progress Notes (Signed)
  Subjective:    Patient ID: Kelsey Hudson, female    DOB: 03-03-1956, 55 y.o.   MRN: [id number]  HPI Ms. Hafner is a 55 year old woman patient of Dr. Onalee Hua who is here today for a yearly checkup.   Patient complains of pain in her wrist and numbness in her hand left more than right. She sees that she has been using her left wrist excessively due to her recent work job requirements. Patient says that she sleeps with her left wrist in folded. Patient denies any trauma, neck pain, recent weight loss, change in appetite, change in urinary habits, fatigue or change in sleeping habits.  Patient also complains that she has been having some dizziness last 2-3 weeks. Her blood pressure has always been a little high. Patient says that it's very difficult to explain exactly how she feels. Patient denies any feelings of palpitations, shortness of breath, chest pain. There are no exacerbating or relieving factors.  Patient is currently smoking about 1 pack a day. Patient is willing to stop smoking but wants to do it by herself she is agreeable to see social work for counseling purposes.  Patient has been compliant with her blood pressure medications.  Patient is due for a colonoscopy and tetanus shot. I would give her a tetanus shot today.   Review of Systems  Constitutional: Negative for fever, activity change and appetite change.  HENT: Negative for sore throat.   Respiratory: Negative for cough and shortness of breath.   Cardiovascular: Negative for chest pain and leg swelling.  Gastrointestinal: Negative for nausea, abdominal pain, diarrhea, constipation and abdominal distention.  Genitourinary: Negative for frequency, hematuria and difficulty urinating.  Neurological: Negative for dizziness and headaches.  Psychiatric/Behavioral: Negative for suicidal ideas and behavioral problems.       Objective:   Physical Exam  Constitutional: She is oriented to person, place, and time. She appears  well-developed and well-nourished.  HENT:  Head: Normocephalic and atraumatic.  Eyes: Conjunctivae and EOM are normal. Pupils are equal, round, and reactive to light. No scleral icterus.  Neck: Normal range of motion. Neck supple. No JVD present. No thyromegaly present.  Cardiovascular: Normal rate, regular rhythm, normal heart sounds and intact distal pulses.  Exam reveals no gallop and no friction rub.   No murmur heard. Pulmonary/Chest: Effort normal and breath sounds normal. No respiratory distress. She has no wheezes. She has no rales.  Abdominal: Soft. Bowel sounds are normal. She exhibits no distension and no mass. There is no tenderness. There is no rebound and no guarding.  Musculoskeletal: Normal range of motion. She exhibits no edema and no tenderness.       Tinel's sign negative.  Lymphadenopathy:    She has no cervical adenopathy.  Neurological: She is alert and oriented to person, place, and time.  Psychiatric: She has a normal mood and affect. Her behavior is normal.          Assessment & Plan:

## 2010-09-16 ENCOUNTER — Telehealth: Payer: Self-pay | Admitting: Licensed Clinical Social Worker

## 2010-09-20 NOTE — Telephone Encounter (Signed)
Sending [facility] information and my card to call when she is ready for counseling.

## 2010-09-20 NOTE — Telephone Encounter (Signed)
09/20/10  Left another message.

## 2010-10-21 NOTE — H&P (Signed)
[facility]  Patient:    Kelsey Hudson, Kelsey Hudson                       MRN: [id number] Adm. Date:  03/06/00 Attending:  Rande Brunt. Clarke-Pearson, M.D. CC:         Brook A. Edward Jolly, M.D.             Telford Nab, R.N.                         History and Physical  GYN ONCOLOGY PROGRAM  HISTORY:  The patient is a 55 year old white female referred by Dr. Conley Simmonds of consultation regarding management of an ovarian cyst.  The patient initially presented to Dr. Edward Jolly with abnormal uterine bleeding, whereupon an ultrasound was obtained.  This showed an endometrial stripe of 2.1 cm.  The patient also had a biopsy showing benign secretory endometrium.  She has been placed on cyclic Provera with control of her bleeding.  The ultrasound also showed bilateral complex cystic lesions in the adnexa, on the right measuring 7.4 x 6.5 x 7 cm and, on the left, 4.9 x 5 x 5.1 cm with multiple septations. No free fluid was noted.  Subsequently, the patient has had a CAT scan of the abdomen and pelvis showing complex masses in the pelvis with no evidence of free fluid.  The patients CA-125 value is 7.7.  Her other laboratory work is normal except for a platelet count of 428,000.  PAST MEDICAL HISTORY:  MEDICAL ILLNESSES:  None.  PAST SURGICAL HISTORY:   None.  DRUG ALLERGIES:  None.  CURRENT MEDICATIONS:  None.  FAMILY HISTORY:  The patients grandmother had breast cancer.  She has a sister with endometriosis.  REVIEW OF SYSTEMS:  No constitutional symptoms and no weight change.  The patient has a minimal amount of vaginal spotting at the present time.  she denies any other GI or GU symptoms.  She has no neurologic, cardiovascular or pulmonary symptoms.  PHYSICAL EXAMINATION:  VITAL SIGNS:  Height 5 feet 1 inch, weight 163 pounds, blood pressure 160/88, pulse 76, respirations 18.  GENERAL:  Healthy white female in no acute distress.  HEENT:  Negative.  NECK:   Supple without thyromegaly.  ABDOMEN:  Soft and nontender.  No mass, organomegaly, ascites or hernias are noted.  PELVIC:  EG/BUS is normal.  The vagina is clean.  The cervix is normal.  The uterus is anterior, normal shape, size and consistency.  There is a fullness throughout the pelvis, but I am unable to define a discrete mass.  IMPRESSION:  Bilateral ovarian cystic masses with thin septations.  No evidence of free fluid or nodularities with a normal CA-125.  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  The patient is in agreement with this plan.  I have contacted Dr. Conley Simmonds and indicated that I thought the risk  of ovarian cancer was extremely low, but that I would be happy to be on standby on the day of surgery to assist if it turned out that this was an ovarian cancer. DD:  03/06/00 TD:  03/06/00 Job: [id number]

## 2010-10-21 NOTE — Group Therapy Note (Signed)
NAME:  Kelsey Hudson, Kelsey Hudson                         ACCOUNT NO.:  [id number]   MEDICAL RECORD NO.:  [id number]                   PATIENT TYPE:  OUT   LOCATION:  [facility]                           FACILITY:  [facility]   PHYSICIAN:  Argentina Donovan, MD                     DATE OF BIRTH:  Feb 18, 1956   DATE OF SERVICE:  06/25/2003                                    CLINIC NOTE   REASON FOR VISIT:  The patient is a 55 year old white female 2 years post  total supracervical hysterectomy with bilateral salpingo-oophorectomy for  endometriosis.  She has been on estradiol 2 mg since that time, has been on  atenolol and hydrochlorothiazide for blood pressure and now is running a  very normal blood pressure 120/77.  She had a significant weight loss down  to 128 pounds and is otherwise in good health.  She, however, smokes 1 pack  a day and is not quite ready to stop that.  We thought that we could reduce  the amount of estrogen she is on.  I discussed the problems with estrogen as  well as the benefits and we are switching her to estradiol 0.5 mg daily.  The patient will also be getting her baseline mammogram.  She has never had  one.   PHYSICAL EXAMINATION:  NECK:  Thyroid has no nodules.  BREASTS:  Soft, symmetrical, and no nipple discharge and no dominant masses.  PELVIC:  A Pap smear was taken since the patient does have a remaining  cervix.   Her main complaint was vaginal dryness.  We discussed the use of a personal  lubricant by Oletta Darter as well as Astroglide.   DIAGNOSIS:  Routine gynecological annual examination with estrogen  supplementation which she has also been encouraged to take calcium  supplement.                                               Argentina Donovan, MD    PR/MEDQ  D:  06/25/2003  T:  06/26/2003  Job:  [id number]

## 2010-10-21 NOTE — Op Note (Signed)
[facility]  Patient:    Kelsey Hudson, Kelsey Hudson Visit Number: [id number] MRN: [id number]          Service Type: GYN Location: [id number] Attending Physician:  Amada Kingfisher. Dictated by:   Argentina Donovan, M.D. Proc. Date: 08/29/01 Admit Date:  08/29/2001 Discharge Date: 09/01/2001                             Operative Report  PROCEDURE:                    Subtotal abdominal hysterectomy, bilateral salpingo-oophorectomy, lysis of pelvic adhesions.  PREOPERATIVE DIAGNOSES:       Complex ovarian cysts.  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  OPERATIVE FINDINGS:           On entering the peritoneal cavity were moderate pelvic adhesions that plastered both of the adnexa including the ovary and the fallopian tubes to the lateral pelvic walls with bilateral hydrosalpinx measuring approximately 2 cm in diameter.  The uterus in the midline was of normal, size, shape, and consistency, but was bound down by adhesions from the posterior portion of fundus and uterosacrals to the cul-de-sac and sigmoid.  PROCEDURE:                    Under satisfactory general anesthesia with the patient in a dorsal supine position, a Foley catheter in the urinary bladder, the abdomen was prepped and draped in the usual sterile manner and entered through a Pfannenstiel incision situated 2 cm above the symphysis pubis and extended for a total length of 16 cm.  The abdomen was entered by layers.  On entering the peritoneal cavity the upper abdominal viscera was explored and found to be within normal limits.  Attention was directed to the pelvis. Findings were as above.  The adhesions in the pelvis were broken up by blunt and sharp dissection and we were able to free up the uterus.  Because the adnexa was plastered to the adjacent walls, it was decided to leave those until we finished the  procedure.  Heaney clamps were placed parallel to the uterus, close to the uterus, placed across the fallopian tubes, utero-ovarian ligament including the round ligament.  The round ligaments were then ligated and divided and the anterior leaf of the broad ligaments opened by sharp dissection and extended around the anterior superior portion of the cervix and openings made in the avascular portion of the broad ligaments.  Free ties placed through these, placed around the utero-ovarian ligaments.  Second clamp placed medial to the aforementioned tie.  Tissue medially divided and the lateral pedicles ligated with #1 chromic catgut suture ligatures.  The bladder was then pushed away from the lower uterine segment.  The uterine vessels were skeletonized, doubly clamped, divided, and ligated with #1 chromic catgut suture ligatures.  Once this was done the cardinal ligaments were serially clamped with Heaney clamps, divided, and ligated with #1 chromic catgut suture ligatures.  The cervix was very long and there was thick scarring.  It was decided once we got down to the lower portion of the cervix to leave the small area of the cervix below and the uterus and the superior part of the cervix were dissected away from the apex.  Thus, sending a specimen of the portion of the cervix and  the uterus for pathologic diagnosis.  Figure-of-eight sutures were used to close the remaining cervical stump and attention was directed to the adnexa which were freed up by blunt and sharp dissection on either side. The infundibulopelvic ligaments were dissected freely by opening up the peritoneum and once this was done Heaney clamps were placed across the pedicles.  The ureters were identified on both sides and the tissue medially excised.  Lateral pedicles ligated doubly with #1 chromic catgut suture ligatures.  Small bleeders were coagulated with hot cautery.  Two small bleeders were also tied with #1 chromic  catgut.  Once we were certain the pelvis was clean, was irrigated, and the large bowel placed over the pelvic suture line.  The fascia was closed with continuous running locked 0 PDS and atraumatic needles.  ______ incision made in the mid point.  Skin edges approximated with skin staples.  Dry sterile dressing applied.  Tape, instrument, sponge, and needle reported correct at the end of the procedure. Blood loss 200 cc.  Foley catheter drained clear amber urine at the end of procedure.  Patient tolerated the procedure well and was transferred to recovery room in satisfactory condition. Dictated by:   Argentina Donovan, M.D. Attending Physician:  Amada Kingfisher. DD:  08/29/01 TD:  08/30/01 Job: [id number]

## 2010-10-21 NOTE — H&P (Signed)
[facility]  Patient:    Kelsey Hudson, Kelsey Hudson Visit Number: [id number] MRN: [id number]          Service Type: Attending:  Elinor Dodge, M.D. Dictated by:   Elinor Dodge, M.D. Adm. Date:  08/13/01                           History and Physical  CHIEF COMPLAINT: Told she has persistent pelvic mass.  HISTORY OF PRESENT ILLNESS: The patient is a 55 year old white female, para 1, [birth year] 55 years old, who has never been able to conceive in the years since then.  Has been followed in the GYN clinic for several years and was discovered to have bilateral complex pelvic masses about one year ago.  Those were followed up in September 2000 and one had resolved.  A second mass appeared to be separate from the ovary, measuring about 7 cm in diameter, and thought to be possibly hydrosalpinx.  The patient had been psychologically prepared for surgery on several occasions, and this was not carried out.  She has somewhat irregular periods but the main reason she decided on surgery is for the persistent pelvic mass and the fear of cancer.  She had a colposcopy back in September 2002 for an atypical Pap smear, which showed no dysplasia. The patient is going to be set up for total abdominal hysterectomy and bilateral salpingo-oophorectomy.  We discussed with the patient the possibility of preserving one ovary and she has decided she wants complete pelvic cleanout, is willing to take estrogen postoperatively, and we discussed the possibility of complications with the procedure.  ALLERGIES: The patient has seasonal allergies but no medication allergies.  PAST MEDICAL HISTORY: No past history of surgery or significant illness.  MEDICATIONS: Tylenol for blood pressure, and has been for several years.  FAMILY HISTORY: Noncontributory.  SOCIAL HISTORY: She drinks socially.  Smokes one pack of cigarettes per day, which she has been encouraged to stop.  She states no narcotic  history.  PHYSICAL EXAMINATION:  VITAL SIGNS: Blood pressure 137/80, pulse 62 per minute, respirations 14 per minute, temperature 98.6 discussing.  Weight 169.5 pounds.  Height 5 feet 3 inches.  GENERAL: Well-developed, well-nourished white female in no apparent distress.  HEENT: Normal.  NECK: Supple.  Thyroid symmetrical without masses.  LUNGS: Clear to auscultation and percussion.  HEART: No murmur.  Normal sinus rhythm.  Point of maximal impact fifth intercostal space, mid clavicular line.  BREAST: Symmetrical without dominant masses.  Nipples normal.  No discharge. No axillary nodes noted.  ABDOMEN: Soft, flat, nontender.  No masses, no organomegaly.  No hepatomegaly noted.  GU: External genitalia normal with no lesions.  Introitus was marital.  Vagina clean and well rugated, with clean parous cervix.  Uterus was normal size, shape, consistency, anteflexed, freely movable.  Adnexa could not be well outlined because of the slight obesity of the patient.  RECTAL: Normal with no masses and confirmatory for above bimanual examination.  EXTREMITIES: Normal without varicosities or cyanosis.  DTRs within normal limits.  IMPRESSION: Complex pelvic mass.  PLAN: Scheduled for total abdominal hysterectomy and bilateral salpingo-oophorectomy. Dictated by:   Elinor Dodge, M.D. Attending:  Elinor Dodge, M.D. DD:  08/13/01 TD:  08/14/01 Job: [id number]

## 2010-11-10 ENCOUNTER — Encounter: Payer: Self-pay | Admitting: Internal Medicine

## 2011-10-06 ENCOUNTER — Other Ambulatory Visit: Payer: Self-pay | Admitting: *Deleted

## 2011-10-09 NOTE — Telephone Encounter (Signed)
Message sent to front desk to call pt and sched an appt.

## 2011-10-10 ENCOUNTER — Telehealth: Payer: Self-pay | Admitting: *Deleted

## 2011-10-10 NOTE — Telephone Encounter (Signed)
Talked with pt about refills on meds - Dr Saralyn Pilar refused refills till seen. Last visit to [facility] 09/13/10. Has appt sch 10/2011. Offered for pt to call to see if any cancelations. Stanton Kidney Tyshaun Vinzant RN 10/10/11 11:30AM

## 2011-10-26 ENCOUNTER — Ambulatory Visit (INDEPENDENT_AMBULATORY_CARE_PROVIDER_SITE_OTHER): Payer: Self-pay | Admitting: Internal Medicine

## 2011-10-26 ENCOUNTER — Encounter: Payer: Self-pay | Admitting: Internal Medicine

## 2011-10-26 VITALS — BP 152/85 | HR 105 | Temp 97.3°F | Ht 61.0 in | Wt 146.6 lb

## 2011-10-26 DIAGNOSIS — Z0000 Encounter for general adult medical examination without abnormal findings: Secondary | ICD-10-CM

## 2011-10-26 DIAGNOSIS — F17200 Nicotine dependence, unspecified, uncomplicated: Secondary | ICD-10-CM

## 2011-10-26 DIAGNOSIS — F329 Major depressive disorder, single episode, unspecified: Secondary | ICD-10-CM

## 2011-10-26 DIAGNOSIS — E785 Hyperlipidemia, unspecified: Secondary | ICD-10-CM

## 2011-10-26 DIAGNOSIS — F411 Generalized anxiety disorder: Secondary | ICD-10-CM

## 2011-10-26 DIAGNOSIS — Z5989 Other problems related to housing and economic circumstances: Secondary | ICD-10-CM

## 2011-10-26 DIAGNOSIS — F32A Depression, unspecified: Secondary | ICD-10-CM

## 2011-10-26 DIAGNOSIS — Z599 Problem related to housing and economic circumstances, unspecified: Secondary | ICD-10-CM

## 2011-10-26 DIAGNOSIS — G4700 Insomnia, unspecified: Secondary | ICD-10-CM

## 2011-10-26 DIAGNOSIS — I10 Essential (primary) hypertension: Secondary | ICD-10-CM

## 2011-10-26 DIAGNOSIS — Z598 Other problems related to housing and economic circumstances: Secondary | ICD-10-CM

## 2011-10-26 LAB — LIPID PANEL
Cholesterol: 276 mg/dL — ABNORMAL HIGH (ref 0–200)
HDL: 42 mg/dL (ref >39)
Total CHOL/HDL Ratio: 6.6 Ratio
Triglycerides: 303 mg/dL — ABNORMAL HIGH (ref <150)

## 2011-10-26 LAB — COMPREHENSIVE METABOLIC PANEL
ALT: 28 U/L (ref 0–35)
Alkaline Phosphatase: 76 U/L (ref 39–117)
CO2: 27 mEq/L (ref 19–32)
Glucose, Bld: 75 mg/dL (ref 70–99)
Total Bilirubin: 0.4 mg/dL (ref 0.3–1.2)

## 2011-10-26 NOTE — Patient Instructions (Addendum)
Please follow-up at the clinic in 3-6 months, at which time we will reevaluate your blood pressure, cholesterol, depression  - OR, please follow-up in the clinic sooner if needed.  There have been changes in your medications:  STOP Amlodipine and Prozac  START aspirin 81 mg once daily.  I will fill your prescriptions tomorrow after I get your labs back - at that time, I will let you know if you should continue on the fenofibrate.   Please get your mammogram completed as soon as possible.   You are getting labs today, if they are abnormal, I will give you a call and we will discuss results.   If symptoms worsen, or new symptoms arise, please call the clinic or go to the ER.  Please bring all of your medications in a bag to your next visit.   Smoking Cessation Tips 1-800-QUIT-NOW  This document explains the best ways for you to quit smoking and new treatments to help. It lists new medicines that can double or triple your chances of quitting and quitting for good. It also considers ways to avoid relapses and concerns you may have about quitting, including weight gain.   NICOTINE: A POWERFUL ADDICTION:  If you have tried to quit smoking, you know how hard it can be. It is hard because nicotine is a very addictive drug. For some people, it can be as addictive as heroin or cocaine. Usually, people make 2 or 3 tries, or more, before finally being able to quit. Each time you try to quit, you can learn about what helps and what hurts. Quitting takes hard work and a lot of effort, but you can quit smoking.   QUITTING SMOKING IS ONE OF THE MOST IMPORTANT THINGS YOU WILL EVER DO:  You will live longer, feel better, and live better.  The impact on your body of quitting smoking is felt almost immediately:  Within 20 minutes, blood pressure decreases. Pulse returns to its normal level.  After 8 hours, carbon monoxide levels in the blood return to normal. Oxygen level increases.  After 24 hours,  chance of heart attack starts to decrease. Breath, hair, and body stop smelling like smoke.  After 48 hours, damaged nerve endings begin to recover. Sense of taste and smell improve.  After 72 hours, the body is virtually free of nicotine. Bronchial tubes relax and breathing becomes easier.  After 2 to 12 weeks, lungs can hold more air. Exercise becomes easier and circulation improves.  Quitting will lower your chance of having a heart attack, stroke, cancer, or lung disease:  After 1 year, the risk of coronary heart disease is cut in half.  After 5 years, the risk of stroke falls to the same as a nonsmoker.  After 10 years, the risk of lung cancer is cut in half and the risk of other cancers decreases significantly.  After 15 years, the risk of coronary heart disease drops, usually to the level of a nonsmoker.  If you are pregnant, quitting smoking will improve your chances of having a healthy baby.  The people you live with, especially your children, will be healthier.  You will have extra money to spend on things other than cigarettes.   FIVE KEYS TO QUITTING:  Studies have shown that these 5 steps will help you quit smoking and quit for good. You have the best chances of quitting if you use them together:   1. GET READY  Set a quit date.  Change your  environment.  Get rid of ALL cigarettes, ashtrays, matches, and lighters in your home, car, and place of work.  Do not let people smoke in your home.  Review your past attempts to quit. Think about what worked and what did not.  Once you quit, do not smoke. NOT EVEN A PUFF!   2. GET SUPPORT AND ENCOURAGEMENT  Studies have shown that you have a better chance of being successful if you have help. You can get support in many ways. Tell your family, friends, and coworkers that you are going to quit and need their support. Ask them not to smoke around you.  Talk to your caregivers (doctor, dentist, nurse, pharmacist, psychologist, and/or  smoking counselor).  Get individual, group, or telephone counseling and support. The more counseling you have, the better your chances are of quitting. Programs are available at [employer] and health centers. Call your local health department for information about programs in your area.  Spiritual beliefs and practices may help some smokers quit.  Quit meters are Photographer that keep track of quit statistics, such as amount of "quit-time," cigarettes not smoked, and money saved.  Many smokers find one or more of the many self-help books available useful in helping them quit and stay off tobacco.   3. LEARN NEW SKILLS AND BEHAVIORS  Try to distract yourself from urges to smoke. Talk to someone, go for a walk, or occupy your time with a task.  When you first try to quit, change your routine. Take a different route to work. Drink tea instead of coffee. Eat breakfast in a different place.  Do something to reduce your stress. Take a hot bath, exercise, or read a book.  Plan something enjoyable to do every day. Reward yourself for not smoking.  Explore interactive web-based programs that specialize in helping you quit.   4. GET MEDICINE AND USE IT CORRECTLY .  Medicines can help you stop smoking and decrease the urge to smoke. Combining medicine with the above behavioral methods and support can quadruple your chances of successfully quitting smoking.  Talk with your doctor about these options.  5. BE PREPARED FOR RELAPSE OR DIFFICULT SITUATIONS  Most relapses occur within the first 3 months after quitting. Do not be discouraged if you start smoking again. Remember, most people try several times before they finally quit.  You may have symptoms of withdrawal because your body is used to nicotine. You may crave cigarettes, be irritable, feel very hungry, cough often, get headaches, or have difficulty concentrating.  The withdrawal symptoms are only temporary. They  are strongest when you first quit, but they will go away within 10 to 14 days.    Here are some difficult situations to watch for:  Alcohol. Avoid drinking alcohol. Drinking lowers your chances of successfully quitting.  Caffeine. Try to reduce the amount of caffeine you consume. It also lowers your chances of successfully quitting.  Other smokers. Being around smoking can make you want to smoke. Avoid smokers.  Weight gain. Many smokers will gain weight when they quit, usually less than 10 pounds. Eat a healthy diet and stay active. Do not let weight gain distract you from your main goal, quitting smoking. Some medicines that help you quit smoking may also help delay weight gain. You can always lose the weight gained after you quit.  Bad mood or depression. There are a lot of ways to improve your mood other than smoking.   If  you are having problems with any of these situations, talk to your caregiver.   SPECIAL SITUATIONS OR CONDITIONS:  Studies suggest that everyone can quit smoking. Your situation or condition can give you a special reason to quit. Pregnant women/New mothers: By quitting, you protect your baby's health and your own.  Hospitalized patients: By quitting, you reduce health problems and help healing.  Heart attack patients: By quitting, you reduce your risk of a second heart attack.  Lung, head, and neck cancer patients: By quitting, you reduce your chance of a second cancer.  Parents of children and adolescents: By quitting, you protect your children from illnesses caused by secondhand smoke.   QUESTIONS TO THINK ABOUT: Think about the following questions before you try to stop smoking. You may want to talk about your answers with your caregiver.  Why do you want to quit?  If you tried to quit in the past, what helped and what did not?  What will be the most difficult situations for you after you quit? How will you plan to handle them?  Who can help you through the tough  times? Your family? Friends? Caregiver?  What pleasures do you get from smoking? What ways can you still get pleasure if you quit?   Here are some questions to ask your caregiver:  How can you help me to be successful at quitting?  What medicine do you think would be best for me and how should I take it?  What should I do if I need more help?  What is smoking withdrawal like? How can I get information on withdrawal?   Quitting takes hard work and a lot of effort, but you can quit smoking.   FOR MORE INFORMATION  Smokefree.gov (http://www.davis-sullivan.com/) provides free, accurate, evidence-based information and professional assistance to help support the immediate and long-term needs of people trying to quit smoking.  Document Released: 05/16/2001 Document Re-Released: 11/09/2009  [facility] Patient Information 2011 [place], [place].    Insomnia Insomnia is frequent trouble falling and/or staying asleep. Insomnia can be a long term problem or a short term problem. Both are common. Insomnia can be a short term problem when the wakefulness is related to a certain stress or worry. Long term insomnia is often related to ongoing stress during waking hours and/or poor sleeping habits. Overtime, sleep deprivation itself can make the problem worse. Every little thing feels more severe because you are overtired and your ability to cope is decreased. CAUSES    Stress, anxiety, and depression.   Poor sleeping habits.   Distractions such as TV in the bedroom.   Naps close to bedtime.   Engaging in emotionally charged conversations before bed.   Technical reading before sleep.   Alcohol and other sedatives. They may make the problem worse. They can hurt normal sleep patterns and normal dream activity.   Stimulants such as caffeine for several hours prior to bedtime.   Pain syndromes and shortness of breath can cause insomnia.   Exercise late at night.   Changing time zones may cause sleeping  problems (jet lag).  It is sometimes helpful to have someone observe your sleeping patterns. They should look for periods of not breathing during the night (sleep apnea). They should also look to see how long those periods last. If you live alone or observers are uncertain, you can also be observed at a sleep clinic where your sleep patterns will be professionally monitored. Sleep apnea requires a checkup and treatment. Give your  caregivers your medical history. Give your caregivers observations your family has made about your sleep.   SYMPTOMS    Not feeling rested in the morning.   Anxiety and restlessness at bedtime.   Difficulty falling and staying asleep.  TREATMENT    Your caregiver may prescribe treatment for an underlying medical disorders. Your caregiver can give advice or help if you are using alcohol or other drugs for self-medication. Treatment of underlying problems will usually eliminate insomnia problems.   Medications can be prescribed for short time use. They are generally not recommended for lengthy use.   Over-the-counter sleep medicines are not recommended for lengthy use. They can be habit forming.   You can promote easier sleeping by making lifestyle changes such as:   Using relaxation techniques that help with breathing and reduce muscle tension.   Exercising earlier in the day.   Changing your diet and the time of your last meal. No night time snacks.   Establish a regular time to go to bed.   Counseling can help with stressful problems and worry.   Soothing music and white noise may be helpful if there are background noises you cannot remove.   Stop tedious detailed work at least one hour before bedtime.  HOME CARE INSTRUCTIONS    Keep a diary. Inform your caregiver about your progress. This includes any medication side effects. See your caregiver regularly. Take note of:   Times when you are asleep.   Times when you are awake during the night.   The  quality of your sleep.   How you feel the next day.  This information will help your caregiver care for you.  Get out of bed if you are still awake after 15 minutes. Read or do some quiet activity. Keep the lights down. Wait until you feel sleepy and go back to bed.   Keep regular sleeping and waking hours. Avoid naps.   Exercise regularly.   Avoid distractions at bedtime. Distractions include watching television or engaging in any intense or detailed activity like attempting to balance the household checkbook.   Develop a bedtime ritual. Keep a familiar routine of bathing, brushing your teeth, climbing into bed at the same time each night, listening to soothing music. Routines increase the success of falling to sleep faster.   Use relaxation techniques. This can be using breathing and muscle tension release routines. It can also include visualizing peaceful scenes. You can also help control troubling or intruding thoughts by keeping your mind occupied with boring or repetitive thoughts like the old concept of counting sheep. You can make it more creative like imagining planting one beautiful flower after another in your backyard garden.   During your day, work to eliminate stress. When this is not possible use some of the previous suggestions to help reduce the anxiety that accompanies stressful situations.  MAKE SURE YOU:    Understand these instructions.   Will watch your condition.   Will get help right away if you are not doing well or get worse.  Document Released: 05/19/2000 Document Revised: 05/11/2011 Document Reviewed: 06/19/2007 [facility] Patient Information 2012 [place], [place].

## 2011-10-26 NOTE — Progress Notes (Signed)
Subjective:    Patient ID: NARELLE SCHOENING female   DOB: 1955-07-19 56 y.o.   MRN: [id number]  HPI: Ms.Talea LAYNEY GILLSON is a 56 y.o. with a PMHx of HTN, HLD, Depression, , who presented to clinic today for the following:  1) HTN - Patient does not check blood pressure regularly at home. Currently supposed to be taking amlodipine (Norvase) 10 mg and hydrochlorothiazide (HCTZ) 25 mg. Patient misses her medication 1 time a week, but has been out for 2 weeks. denies dizziness, lightheadedness, chest pain, shortness of breath.  does request refills today.  2) HLD - currently taking Pravastatin and Fenofibrate, but has been out for 2 weeks. Patient misses doses 2 x per week on average. denies chest pain, difficulty breathing, palpitations, tachycardia, and muscle pains. does request refills today.  3) Depression / Anxiety -  is not well controlled on current therapy - stopped taking the Prozac > 1 year, prior to which she was taking it every day for 8 months without adequate relief of symptoms. Associated symptoms include: fatigue, tearful and insomnia. Denies associated suicidal ideation, homicidal ideation, suicidal thoughts with specific plan. States she has significant stressors at home, partner who's health is ailing, very limited financial resources, tries to supportive to everyone else, but has limited support herself. Currently, the patient does not follow with mental health services.  4) Tobacco abuse - patient continues to smoke 1/4 pack of cigarettes per day and has done so for the past 25 years. There have not been prior attempts to quit. Barriers to quit (if any) include: anxiety, significant life stressors.   Review of Systems: Per HPI.   Current Outpatient Medications: has been out of all medications x 2 weeks. Medication Sig  . amLODipine (NORVASC) 10 MG tablet Take 1 tablet (10 mg total) by mouth daily.  . calcium-vitamin D (OSCAL WITH D) 500-200 MG-UNIT per tablet Take 1 tablet by  mouth daily.  . fenofibrate 54 MG tablet Take 1 tablet (54 mg total) by mouth daily.  Marland Kitchen FLUoxetine (PROZAC) 40 MG capsule Take 40 mg by mouth daily.    . hydrochlorothiazide 25 MG tablet Take 1 tablet (25 mg total) by mouth daily.  . pravastatin (PRAVACHOL) 40 MG tablet Take 1 tablet (40 mg total) by mouth daily.    Allergies: No Known Allergies   Past Medical History  Diagnosis Date  . Hypertension   . Hyperlipidemia   . Depression   . Anxiety   . Insomnia   . Tobacco abuse   . Abnormal mammogram 05/2009    Probable benign calcifications in the right breast - BIRADS 3    Past Surgical History  Procedure Date  . Subtotal abdominal hysterectomy with bilateral salpingo-oophorectomy 2003    for complex ovarian cysts.    Objective:    Physical Exam: Filed Vitals:   10/26/11 1449  BP: 152/85  Pulse: 105  Temp: 97.3 F (36.3 C)     General: Vital signs reviewed and noted. Well-developed, well-nourished, in no acute distress; alert, appropriate and cooperative throughout examination.  Head: Normocephalic, atraumatic.  Eyes: conjunctivae/corneas clear. PERRL, EOM's intact. Fundi benign.  Ears: TM nonerythematous, not bulging, good light reflex bilaterally.  Nose: Mucous membranes moist, not inflammed, nonerythematous.  Throat: Oropharynx nonerythematous, no exudate appreciated.   Neck: No deformities, masses, or tenderness noted.  Lungs:  Normal respiratory effort. Clear to auscultation BL without crackles or wheezes.  Heart: RRR. S1 and S2 normal without gallop, rubs. (+) murmur.  Abdomen:  BS normoactive. Soft, Nondistended, non-tender.  No masses or organomegaly.  Extremities: No pretibial edema.  Neurologic: A&O X3, CN II - XII are grossly intact. Motor strength is 5/5 in the all 4 extremities, Sensations intact to light touch, Cerebellar signs negative.    Assessment/ Plan:   Case and plan of care discussed with Dr. Ulyess Mort.

## 2011-10-27 ENCOUNTER — Other Ambulatory Visit: Payer: Self-pay | Admitting: *Deleted

## 2011-10-27 NOTE — Telephone Encounter (Signed)
Pt called states she is out of meds x 2 weeks. You saw her 10/26/11 - waiting on lab work. Pt states you were going to give her Ambien and Zoloft.

## 2011-10-28 MED ORDER — SIMVASTATIN 40 MG PO TABS: 40.0000 mg | ORAL_TABLET | Freq: Every evening | ORAL | Status: DC

## 2011-10-28 MED ORDER — SERTRALINE HCL 50 MG PO TABS: 50.0000 mg | ORAL_TABLET | Freq: Every day | ORAL | Status: DC

## 2011-10-28 MED ORDER — ASPIRIN 81 MG PO TABS: 81.0000 mg | ORAL_TABLET | Freq: Every day | ORAL | Status: DC

## 2011-10-28 MED ORDER — HYDROCHLOROTHIAZIDE 25 MG PO TABS: 25.0000 mg | ORAL_TABLET | Freq: Every day | ORAL | Status: DC

## 2011-11-01 NOTE — Telephone Encounter (Signed)
Left message on pt home ID recording med was refused per Dr Lina Sar - request already responded.

## 2011-11-02 ENCOUNTER — Encounter: Payer: Self-pay | Admitting: Internal Medicine

## 2011-11-02 DIAGNOSIS — Z598 Other problems related to housing and economic circumstances: Secondary | ICD-10-CM | POA: Insufficient documentation

## 2011-11-02 DIAGNOSIS — Z0000 Encounter for general adult medical examination without abnormal findings: Secondary | ICD-10-CM | POA: Insufficient documentation

## 2011-11-02 DIAGNOSIS — Z599 Problem related to housing and economic circumstances, unspecified: Secondary | ICD-10-CM | POA: Insufficient documentation

## 2011-11-02 NOTE — Assessment & Plan Note (Signed)
Assessment: Patient has difficulty with falling asleep, has high stress life right now. Not specifically awakening to urinate or due to snoring, difficulty breathing.  Plan:      Discussed sleep hygiene.  Handout given regarding insomnia.  Will work towards treating her depression, which may also be contributing towards sleep disturbances.  Will not prescribe ambien at present, but can consider in future if symptoms not improved with lifestyle modification/ treatment of depression.

## 2011-11-02 NOTE — Assessment & Plan Note (Signed)
1. The patient was counseled on the dangers of tobacco use, and was advised to quit, referred to a tobacco cessation program and reluctant to quit.   2. Reviewed strategies to maximize success, including:  Removing cigarettes and smoking materials from environment  Stress management  Substitution of other forms of reinforcement Support of family/friends.  Selecting a quit date.  Patient provided contact information for 1-800-QUIT-NOW    

## 2011-11-02 NOTE — Assessment & Plan Note (Signed)
Pertinent Data: BP Readings from Last 3 Encounters:  10/26/11 152/85  09/13/10 157/87  11/17/09 150/87    Basic Metabolic Panel:    Component Value Date/Time   NA 141 10/26/2011 1556   K 4.0 10/26/2011 1556   CL 105 10/26/2011 1556   CO2 27 10/26/2011 1556   BUN 12 10/26/2011 1556   CREATININE 0.67 10/26/2011 1556   CREATININE 0.84 03/15/2009 2012   GLUCOSE 75 10/26/2011 1556   CALCIUM 9.9 10/26/2011 1556    Assessment: Disease Control: not controlled  Progress toward goals: unchanged  Barriers to meeting goals: has been out of medications for 2 weeks because was not following up regularly in clinic. Therefore, refills were refused after missed appointments. Also, has had financial constraints and difficulty affording medications.  .    Patient is compliant most of the time with prescribed medications.   Plan:  Will dc amlodipine  Continue HCTZ alone - as blood pressure is only mildly elevated despite the fact that she has been without medication for 2 weeks already.  Check CMET today to assess renal, hepatic function.

## 2011-11-02 NOTE — Assessment & Plan Note (Signed)
Health Maintenance  Topic Date Due  . Pap Smear  08/21/1975  . Colonoscopy  08/21/2007  . Mammogram  05/13/2011  . Influenza Vaccine  03/05/2012  . Tetanus/tdap  09/12/2020    Assessment:  Due for essentially all preventative health measures.  Does not know if she has her cervix still (after her hysterectomy for complex ovarian cysts) - although has previously been told that no longer needs PAPs.  Last mammo in 2010 was abnormal, with calcifications noted, BIRADs 3 category. States she was given mammogram scholarship at that time, and then was unable to afford to get the diagnostic mammogram that was recommended.  Plan:  Mammogram scholarship given.  Encouraged to meet with Chauncey Reading regarding orange card eligibility  Will consider to refer to SW to see if will qualify for any financial assistance.  Pt refuses all other preventative care measures presently.

## 2011-11-02 NOTE — Assessment & Plan Note (Signed)
Assessment:  Pt is currently taking Prozac for > 8 months and is not controlled on this medication. She was very overwhelmed and tearful during our discussion when describing her life stressors including ailing partner and significant financial limitations, that at times preclude ability to afford medications / make it to doctors' appointments / get preventative health measures. She has limited personal support. Has feelings of depression predominantly, but also depression. Denies SI/HI.  Plan:  Discontinue current regimen.  Start Sertraline - pt advised to be aware of worsening depression symptoms, indications of medication side effect or allergic reaction.  Provided support for which the pt was grateful.  She is hesitant to pursue counseling or other mental health measures at this point, because of cost.

## 2011-11-02 NOTE — Assessment & Plan Note (Addendum)
Pertinent Labs: Liver Function Tests:    Component Value Date/Time   AST 23 10/26/2011 1556   ALT 28 10/26/2011 1556   ALKPHOS 76 10/26/2011 1556   BILITOT 0.4 10/26/2011 1556   PROT 7.3 10/26/2011 1556   ALBUMIN 4.8 10/26/2011 1556    Lipid Panel:     Component Value Date/Time   CHOL 276* 10/26/2011 1556   TRIG 303* 10/26/2011 1556   HDL 42 10/26/2011 1556   CHOLHDL 6.6 10/26/2011 1556   VLDL 61* 10/26/2011 1556   LDLCALC 173* 10/26/2011 1556     Assessment: Disease Control: not controlled  Progress toward goals: unable to assess  Barriers to meeting goals: financial need and nonadherence to medications    Out of meds x 2 weeks at least. Not regularly taking fenofibrate.   Patient is fasting today.   Patient is compliant most of the time with prescribed medications.    Plan:  continue current medications  Will recheck FLP, if elevated, will escalate therapy as appropriate.  Will refer to Chauncey Reading to see if she may qualify for orange card - states she has been refused multiple time previously.  Start daily aspirin.  ADDENDUM TO ABOVE AFTER GETTING LAB RESULTS:  LDL not at goal  Has not been regularly taking the fenofibrate.  Since trigs < 400, will try to treat with statin alone, which can impact both LDL and trigs.  Will change to simvastatin - patient states she will be able to afford this as long as < $30/ month  Will check FLP at next visit and adjust accordinly.  Will need repeat    Johnette Abraham, D.O. 11/02/2011, 9:21 PM

## 2011-11-02 NOTE — Assessment & Plan Note (Signed)
Assessment:  Significant financial constraints  Is still apparently trying to pay off prior clinic bills, therefore, she is hesitant to come for frequent visits.   As well, she is reluctant to get labs drawn, will only allow bare minimal.   States she has previously been denied orange card because her partner's income (whom she is not married to) is accounted for as well, and she is overqualified apparently.   Plan:      I think her concerns are reasonable, and I can appreciate that she is trying to do the right thing in terms of paying her owed bills. However, I am concerned about this affecting her overall health if she is unable to reliably come into appointments for labs, etc to assess medications are not adversely affecting her.  We discussed trying to consolidate her medications / need labs so as to be most cost effective and yet still most effective towards her health.  Therefore, will not refill her medications until I get the CMET, FLP back from today and will make adjustments to medications at that point.  Will consider referral to Lynnae January and possibly Chauncey Reading in future to see she may be eligible for any assistance - will need to first check how much their encounters may cost.

## 2011-11-21 ENCOUNTER — Telehealth: Payer: Self-pay | Admitting: *Deleted

## 2011-11-21 NOTE — Telephone Encounter (Signed)
Patient stated she is over income when asked about FC/eligibility.  I saw that she is self pay and noted from the transaction inq. That she is attempting to pay but account has gone to a collection agency.  Is she over income and if she is please document in Epic.  If not, a note to remind her again to see you at the next visit would help. Tx

## 2012-02-22 ENCOUNTER — Ambulatory Visit (INDEPENDENT_AMBULATORY_CARE_PROVIDER_SITE_OTHER): Payer: Self-pay | Admitting: Internal Medicine

## 2012-02-22 ENCOUNTER — Encounter: Payer: Self-pay | Admitting: Internal Medicine

## 2012-02-22 VITALS — BP 138/82 | HR 73

## 2012-02-22 DIAGNOSIS — Z0000 Encounter for general adult medical examination without abnormal findings: Secondary | ICD-10-CM

## 2012-02-22 DIAGNOSIS — E785 Hyperlipidemia, unspecified: Secondary | ICD-10-CM

## 2012-02-22 DIAGNOSIS — F411 Generalized anxiety disorder: Secondary | ICD-10-CM

## 2012-02-22 DIAGNOSIS — I10 Essential (primary) hypertension: Secondary | ICD-10-CM

## 2012-02-22 DIAGNOSIS — F17200 Nicotine dependence, unspecified, uncomplicated: Secondary | ICD-10-CM

## 2012-02-22 DIAGNOSIS — F329 Major depressive disorder, single episode, unspecified: Secondary | ICD-10-CM

## 2012-02-22 MED ORDER — SERTRALINE HCL 50 MG PO TABS: 25.0000 mg | ORAL_TABLET | Freq: Every day | ORAL | Status: DC

## 2012-02-22 NOTE — Assessment & Plan Note (Signed)
Health Maintenance  Topic Date Due  . Tetanus/tdap  09/12/2020  . Pap Smear  08/21/1975  . Colonoscopy  08/21/2007  . Mammogram  05/13/2011  . Influenza Vaccine  02/04/2012    Assessment:  Due for essentially all preventative health measures.  Does not know if she has her cervix still (after her hysterectomy for complex ovarian cysts) - although has previously been told that no longer needs PAPs.  Last mammo in 2010 was abnormal, with calcifications noted, BIRADs 3 category. States she was given mammogram scholarship at that time, and then was unable to afford to get the diagnostic mammogram that was recommended.  During our last visit together in may 2013, the patient was given another mammogram scholarship. However, she no showed, attributing it towards being too busy.  The patient was educated about the importance of followup of her abnormal mammogram and the risks of not completing followup.  Plan:  Encouraged to meet with Chauncey Reading regarding orange card eligibility  Pt refuses all other preventative care measures presently - the risks of continuing to refuse all preventative care measures were again discussed with the patient

## 2012-02-22 NOTE — Assessment & Plan Note (Signed)
Pertinent Labs: Liver Function Tests:    Component Value Date/Time   AST 23 10/26/2011 1556   ALT 28 10/26/2011 1556   ALKPHOS 76 10/26/2011 1556   BILITOT 0.4 10/26/2011 1556   PROT 7.3 10/26/2011 1556   ALBUMIN 4.8 10/26/2011 1556    Lipid Panel:     Component Value Date/Time   CHOL 276* 10/26/2011 1556   TRIG 303* 10/26/2011 1556   HDL 42 10/26/2011 1556   CHOLHDL 6.6 10/26/2011 1556   VLDL 61* 10/26/2011 1556   LDLCALC 173* 10/26/2011 1556     Assessment: Goal LDL (per ATP guidelines): < 130 mg/dL  Disease Control: not controlled  Progress toward goals: unable to assess  Barriers to meeting goals: nonadherence to medications and lack of understanding of disease management    Patient is still missing her medications 2-3 times a week.     Plan:  continue current medications  Patient was instructed to improve medication compliance.  We discussed the importance of medication compliance and the health adversties that can occur if her cholesterol remains uncontrolled, especially in the setting of her tobacco abuse.  Plan to recheck lipid panel next visit (if she has started complying with medications.)  Check CMET today.

## 2012-02-22 NOTE — Patient Instructions (Signed)
Please follow-up at the clinic in 6 months, at which time we will reevaluate your blood pressure, depression - OR, please follow-up in the clinic sooner if needed.  There have been changes in your medications:  DECREASE your Zoloft to 25 mg daily.   You are getting labs today, if they are abnormal I will give you a call.   Please work on stopping smoking. See tips below.  If symptoms worsen, or new symptoms arise, please call the clinic or go to the ER.  Please bring all of your medications in a bag to your next visit.  Smoking Cessation Tips 1-800-QUIT-NOW  This document explains the best ways for you to quit smoking and new treatments to help. It lists new medicines that can double or triple your chances of quitting and quitting for good. It also considers ways to avoid relapses and concerns you may have about quitting, including weight gain.   NICOTINE: A POWERFUL ADDICTION:  If you have tried to quit smoking, you know how hard it can be. It is hard because nicotine is a very addictive drug. For some people, it can be as addictive as heroin or cocaine. Usually, people make 2 or 3 tries, or more, before finally being able to quit. Each time you try to quit, you can learn about what helps and what hurts. Quitting takes hard work and a lot of effort, but you can quit smoking.   QUITTING SMOKING IS ONE OF THE MOST IMPORTANT THINGS YOU WILL EVER DO:  You will live longer, feel better, and live better.  The impact on your body of quitting smoking is felt almost immediately:  Within 20 minutes, blood pressure decreases. Pulse returns to its normal level.  After 8 hours, carbon monoxide levels in the blood return to normal. Oxygen level increases.  After 24 hours, chance of heart attack starts to decrease. Breath, hair, and body stop smelling like smoke.  After 48 hours, damaged nerve endings begin to recover. Sense of taste and smell improve.  After 72 hours, the body is virtually free of  nicotine. Bronchial tubes relax and breathing becomes easier.  After 2 to 12 weeks, lungs can hold more air. Exercise becomes easier and circulation improves.  Quitting will lower your chance of having a heart attack, stroke, cancer, or lung disease:  After 1 year, the risk of coronary heart disease is cut in half.  After 5 years, the risk of stroke falls to the same as a nonsmoker.  After 10 years, the risk of lung cancer is cut in half and the risk of other cancers decreases significantly.  After 15 years, the risk of coronary heart disease drops, usually to the level of a nonsmoker.  If you are pregnant, quitting smoking will improve your chances of having a healthy baby.  The people you live with, especially your children, will be healthier.  You will have extra money to spend on things other than cigarettes.   FIVE KEYS TO QUITTING:  Studies have shown that these 5 steps will help you quit smoking and quit for good. You have the best chances of quitting if you use them together:   1. GET READY  Set a quit date.  Change your environment.  Get rid of ALL cigarettes, ashtrays, matches, and lighters in your home, car, and place of work.  Do not let people smoke in your home.  Review your past attempts to quit. Think about what worked and what did not.  Once you quit, do not smoke. NOT EVEN A PUFF!   2. GET SUPPORT AND ENCOURAGEMENT  Studies have shown that you have a better chance of being successful if you have help. You can get support in many ways. Tell your family, friends, and coworkers that you are going to quit and need their support. Ask them not to smoke around you.  Talk to your caregivers (doctor, dentist, nurse, pharmacist, psychologist, and/or smoking counselor).  Get individual, group, or telephone counseling and support. The more counseling you have, the better your chances are of quitting. Programs are available at [employer] and health centers. Call your local health  department for information about programs in your area.  Spiritual beliefs and practices may help some smokers quit.  Quit meters are Photographer that keep track of quit statistics, such as amount of "quit-time," cigarettes not smoked, and money saved.  Many smokers find one or more of the many self-help books available useful in helping them quit and stay off tobacco.   3. LEARN NEW SKILLS AND BEHAVIORS  Try to distract yourself from urges to smoke. Talk to someone, go for a walk, or occupy your time with a task.  When you first try to quit, change your routine. Take a different route to work. Drink tea instead of coffee. Eat breakfast in a different place.  Do something to reduce your stress. Take a hot bath, exercise, or read a book.  Plan something enjoyable to do every day. Reward yourself for not smoking.  Explore interactive web-based programs that specialize in helping you quit.   4. GET MEDICINE AND USE IT CORRECTLY .  Medicines can help you stop smoking and decrease the urge to smoke. Combining medicine with the above behavioral methods and support can quadruple your chances of successfully quitting smoking.  Talk with your doctor about these options.  5. BE PREPARED FOR RELAPSE OR DIFFICULT SITUATIONS  Most relapses occur within the first 3 months after quitting. Do not be discouraged if you start smoking again. Remember, most people try several times before they finally quit.  You may have symptoms of withdrawal because your body is used to nicotine. You may crave cigarettes, be irritable, feel very hungry, cough often, get headaches, or have difficulty concentrating.  The withdrawal symptoms are only temporary. They are strongest when you first quit, but they will go away within 10 to 14 days.    Here are some difficult situations to watch for:  Alcohol. Avoid drinking alcohol. Drinking lowers your chances of successfully quitting.  Caffeine.  Try to reduce the amount of caffeine you consume. It also lowers your chances of successfully quitting.  Other smokers. Being around smoking can make you want to smoke. Avoid smokers.  Weight gain. Many smokers will gain weight when they quit, usually less than 10 pounds. Eat a healthy diet and stay active. Do not let weight gain distract you from your main goal, quitting smoking. Some medicines that help you quit smoking may also help delay weight gain. You can always lose the weight gained after you quit.  Bad mood or depression. There are a lot of ways to improve your mood other than smoking.   If you are having problems with any of these situations, talk to your caregiver.   SPECIAL SITUATIONS OR CONDITIONS:  Studies suggest that everyone can quit smoking. Your situation or condition can give you a special reason to quit. Pregnant women/New mothers: By quitting,  you protect your baby's health and your own.  Hospitalized patients: By quitting, you reduce health problems and help healing.  Heart attack patients: By quitting, you reduce your risk of a second heart attack.  Lung, head, and neck cancer patients: By quitting, you reduce your chance of a second cancer.  Parents of children and adolescents: By quitting, you protect your children from illnesses caused by secondhand smoke.   QUESTIONS TO THINK ABOUT: Think about the following questions before you try to stop smoking. You may want to talk about your answers with your caregiver.  Why do you want to quit?  If you tried to quit in the past, what helped and what did not?  What will be the most difficult situations for you after you quit? How will you plan to handle them?  Who can help you through the tough times? Your family? Friends? Caregiver?  What pleasures do you get from smoking? What ways can you still get pleasure if you quit?   Here are some questions to ask your caregiver:  How can you help me to be successful at quitting?    What medicine do you think would be best for me and how should I take it?  What should I do if I need more help?  What is smoking withdrawal like? How can I get information on withdrawal?   Quitting takes hard work and a lot of effort, but you can quit smoking.   FOR MORE INFORMATION  Smokefree.gov (http://www.davis-sullivan.com/) provides free, accurate, evidence-based information and professional assistance to help support the immediate and long-term needs of people trying to quit smoking.  Document Released: 05/16/2001 Document Re-Released: 11/09/2009  [facility] Patient Information 2011 [place], [place].

## 2012-02-22 NOTE — Assessment & Plan Note (Signed)
1. The patient was counseled on the dangers of tobacco use, which include, but are not limited to cardiovascular disease, increased cancer risk of multiple types of cancer, COPD, peripheral vascular disease, strokes. 2. She was also counseled on the benefits of smoking cessation. 3. The patient was firmly advised to quit and referred to a tobacco cessation program.   4. We also reviewed strategies to maximize success, including:  Removing cigarettes and smoking materials from environment  Stress management  Substitution of other forms of reinforcement Support of family/friends.  Selecting a quit date.  Patient provided contact information for 1-800-QUIT-NOW   

## 2012-02-22 NOTE — Assessment & Plan Note (Signed)
Assessment:  Pt is currently off of Zoloft for last 2 weeks because was causing significant fatigue and sleepiness. She was started on this medication in may 2013. She is previously failed Prozac. She continues to have depressive symptoms secondary to significant life stressors and low personal support. Denies SI/HI.   Plan:  Reduce current regimen - will decrease Zoloft 25 mg daily.  Consider alternative SSRI or a different class of medications if this is ineffective.   Patient was offered but continues to refuse followup with [facility] for mental health services.

## 2012-02-22 NOTE — Progress Notes (Signed)
Subjective:    Patient: Kelsey Hudson   Age: 56 y.o.   Gender: female   MRN: [id number]  DOB: 01/30/1956     HPI: [name] is a 56 y.o. with a PMHx HTN, HLD, and depression who presented to clinic today for the following:  1) HTN - Patient does not check blood pressure regularly at home. Currently supposed to be taking hydrochlorothiazide (HCTZ) 25 mg. Patient misses her medication 0 time a week. denies dizziness, lightheadedness, chest pain, shortness of breath.  does request refills today.  2) HLD - currently taking Simvastatin 40mg  daily, misses it 2-3 times per week because forgets to take it at night. Denies chest pain, difficulty breathing, palpitations, tachycardia, and muscle pains. does request refills today.  3) Depression / Anxiety - the patient is not currently on therapy, because she stopped her Zoloft 2 weeks ago (it was started during our last visit in 10/2011) - secondary to excessive resultant sleepiness. She was previously also tried on Prozac (not in our clinic) because it was not providing significant improvement of depressive symptoms (despite being on it for 8 months). Associated symptoms include: fatigue, tearful and insomnia. Denies associated suicidal ideation, homicidal ideation, suicidal thoughts with specific plan. 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 Currently, the patient does not follow with mental health services.  4) Tobacco abuse - patient continues to smoke 1/4 pack of cigarettes per day and has done so for the past 25 years. There have not been prior attempts to quit. Barriers to quit (if any) include: anxiety, significant life stressors.  5) Preventative care - patient has not followed-up with [facility] as previously recommended, because she felt that she would be denied for orange  card again as has been the case in the past. She is due for most all preventative care matters and refuses all.    Review of Systems: Per HPI.    Current Outpatient Medications: Medication Sig  . aspirin 81 MG tablet Take 1 tablet (81 mg total) by mouth daily.  . calcium-vitamin D (OSCAL WITH D) 500-200 MG-UNIT per tablet Take 1 tablet by mouth daily.  . hydrochlorothiazide (HYDRODIURIL) 25 MG tablet Take 1 tablet (25 mg total) by mouth daily.  . simvastatin (ZOCOR) 40 MG tablet Take 1 tablet (40 mg total) by mouth every evening.    Allergies: No Known Allergies  Past Medical History  Diagnosis Date  . Hypertension   . Depression   . Anxiety   . Insomnia   . Tobacco abuse   . Abnormal mammogram 05/2009    Probable benign calcifications in the right breast - BIRADS 3  . Hyperlipidemia LDL goal < 130 06/23/2008    TG decreased 543 on 11/06 to 269 11/07    Past Surgical History  Procedure Date  . Subtotal abdominal hysterectomy with bilateral salpingo-oophorectomy 2003    for complex ovarian cysts.     Objective:    Physical Exam: Filed Vitals:   02/22/12 1623  BP: 138/82  Pulse: 73     General: Vital signs reviewed and noted. Well-developed, well-nourished, in no acute distress; alert, appropriate and cooperative throughout examination.  Head: Normocephalic, atraumatic.  Throat: Oropharynx nonerythematous, no exudate appreciated.   Lungs:  Normal respiratory effort. Clear to auscultation BL without crackles or wheezes.  Heart: RRR. S1 and S2 normal without  gallop, rubs. (+) murmur.  Abdomen:  BS normoactive. Soft, Nondistended, non-tender.  No masses or organomegaly.  Extremities: No pretibial edema.    Assessment/ Plan:   Case and plan of care discussed with Dr. Blanch Media.

## 2012-02-23 LAB — COMPREHENSIVE METABOLIC PANEL
Albumin: 4.8 g/dL (ref 3.5–5.2)
Alkaline Phosphatase: 75 U/L (ref 39–117)
CO2: 30 mEq/L (ref 19–32)
Calcium: 10.5 mg/dL (ref 8.4–10.5)
Chloride: 102 mEq/L (ref 96–112)
Creat: 0.69 mg/dL (ref 0.50–1.10)
Glucose, Bld: 72 mg/dL (ref 70–99)
Total Protein: 7.4 g/dL (ref 6.0–8.3)

## 2012-06-29 ENCOUNTER — Other Ambulatory Visit: Payer: Self-pay | Admitting: Internal Medicine

## 2012-08-21 ENCOUNTER — Encounter: Payer: Self-pay | Admitting: Internal Medicine

## 2012-08-21 ENCOUNTER — Ambulatory Visit (INDEPENDENT_AMBULATORY_CARE_PROVIDER_SITE_OTHER): Payer: Self-pay | Admitting: Internal Medicine

## 2012-08-21 VITALS — BP 162/96 | HR 83 | Temp 97.5°F | Ht 61.0 in | Wt 159.8 lb

## 2012-08-21 DIAGNOSIS — Z720 Tobacco use: Secondary | ICD-10-CM | POA: Insufficient documentation

## 2012-08-21 DIAGNOSIS — I10 Essential (primary) hypertension: Secondary | ICD-10-CM

## 2012-08-21 DIAGNOSIS — R519 Headache, unspecified: Secondary | ICD-10-CM | POA: Insufficient documentation

## 2012-08-21 DIAGNOSIS — F17200 Nicotine dependence, unspecified, uncomplicated: Secondary | ICD-10-CM

## 2012-08-21 DIAGNOSIS — R51 Headache: Secondary | ICD-10-CM

## 2012-08-21 MED ORDER — CALCIUM CARBONATE-VITAMIN D 500-200 MG-UNIT PO TABS: 1.0000 | ORAL_TABLET | Freq: Every day | ORAL | Status: DC

## 2012-08-21 MED ORDER — ASPIRIN 81 MG PO TABS: 81.0000 mg | ORAL_TABLET | Freq: Every day | ORAL | Status: DC

## 2012-08-21 MED ORDER — SIMVASTATIN 40 MG PO TABS: 40.0000 mg | ORAL_TABLET | Freq: Every evening | ORAL | Status: DC

## 2012-08-21 MED ORDER — AMLODIPINE BESYLATE 5 MG PO TABS: 5.0000 mg | ORAL_TABLET | Freq: Every day | ORAL | Status: DC

## 2012-08-21 NOTE — Assessment & Plan Note (Signed)
May be related to hypertension as per above. No alarm signs or symptoms. - Instructed to use ibuprofen 600 mg up to 3 times a day when necessary - Alarm symptoms reviewed, patient able to teach back went to call the clinic or seek immediate medical care

## 2012-08-21 NOTE — Progress Notes (Signed)
Patient ID: Kelsey Hudson, female   DOB: Oct 25, 1955, 57 y.o.   MRN: [id number]  Subjective:   Patient ID: Kelsey Hudson female   DOB: [birth year] 57 y.o.   MRN: [id number]  HPI: [name] is a 58 y.o. female with history of HTN, depression/anxiety, HLD and tobacco abuse presenting to the clinic with complaints of high blood pressure and HA. Patient has been noticing occasional headaches over the past couple months. These headaches occur a couple days a week and may last a few hours. She denies any visual changes, nausea, vomiting, photophobia, dizziness, fever or chills.says the headaches have a pulsating sensation and occur in the temples and behind the eyes. Has tried aspirin for relief which helps occasionally. Can occur at any time of day. Do not wake her from sleep.  She's checked her blood pressure during the time the headaches and has noticed is elevated. Blood pressures during headaches range from 150-190 systolic with diastolic pressures in the 100s. When she's not having headaches, she reports the blood pressures usually 140s over 90s. She reports compliance with hydrochlorothiazide 25 mg daily. Says she is also taking amlodipine in the past and cannot remember why this medication was discontinued. She is due for several health maintenance interventions today, including mammogram (prior abnormal mammogram in 2010) as well as possibly a Pap smear ( question of whether or not she has a cervix after hysterectomy). She denies referral for mammogram today. She continues to smoke at least a pack a day. Says she's not ready to discuss cessation today.    Past Medical History  Diagnosis Date  . Hypertension   . Depression   . Anxiety   . Insomnia   . Tobacco abuse   . Abnormal mammogram 05/2009    Probable benign calcifications in the right breast - BIRADS 3  . Hyperlipidemia LDL goal < 130 06/23/2008    TG decreased 543 on 11/06 to 269 11/07   Current Outpatient Prescriptions   Medication Sig Dispense Refill  . aspirin 81 MG tablet Take 1 tablet (81 mg total) by mouth daily.  30 tablet    . calcium-vitamin D (OSCAL WITH D) 500-200 MG-UNIT per tablet Take 1 tablet by mouth daily.  30 tablet  11  . hydrochlorothiazide (HYDRODIURIL) 25 MG tablet TAKE ONE TABLET BY MOUTH EVERY DAY  30 tablet  4  . sertraline (ZOLOFT) 50 MG tablet Take 0.5 tablets (25 mg total) by mouth daily.  30 tablet  5  . simvastatin (ZOCOR) 40 MG tablet Take 1 tablet (40 mg total) by mouth every evening.  30 tablet  5   No current facility-administered medications for this visit.   Family History  Problem Relation Age of Onset  . Diabetes Mother   . COPD Mother   . Heart disease Mother     CHF  . Diabetes Father   . Diabetes Sister   . Heart disease Sister     died of enlarged heart  . Endometriosis Sister    History   Social History  . Marital Status: Divorced    Spouse Name: N/A    Number of Children: 1  . Years of Education: 10th grade   Occupational History  . part-time     caregiver for elderly gentleman   Social History Main Topics  . Smoking status: Current Some Day Smoker -- 0.50 packs/day for 25 years    Types: Cigarettes  . Smokeless tobacco: Never Used     Comment:  Can do herself  . Alcohol Use: Yes     Comment: occasional - 1x per month.  . Drug Use: No  . Sexually Active: Not on file   Other Topics Concern  . Not on file   Social History Narrative   Lives in [place], [place] with partner.   Review of Systems: 10 pt ROS performed, pertinent positives and negatives noted in HPI Objective:  Physical Exam: There were no vitals filed for this visit. Vitals reviewed. General: sitting in chair, NAD HEENT: PERRL, EOMI, no scleral icterus. No photophobia.  Neck: Normal ROM and no pain w neck flexion, no meningismus Cardiac: RRR, no rubs, murmurs or gallops Pulm: clear to auscultation bilaterally, no wheezes, rales, or rhonchi Abd: soft, nontender, nondistended,  BS present Ext: warm and well perfused, no pedal edema Neuro: alert and oriented X3, cranial nerves II-XII grossly intact, strength and sensation to light touch equal in bilateral upper and lower extremities  Assessment & Plan:  Has appt w PCP 09/12/12. Not willing to discuss need for MMG today.   Please see problem-based charting for assessment and plan.

## 2012-08-21 NOTE — Assessment & Plan Note (Signed)
BP Readings from Last 3 Encounters:  08/21/12 162/96  02/22/12 138/82  10/26/11 152/85    Lab Results  Component Value Date   NA 138 02/22/2012   K 4.1 02/22/2012   CREATININE 0.69 02/22/2012    Assessment:  Blood pressure control: moderately elevated  Progress toward BP goal:  deteriorated  Comments: Reports compliance w HCTZ 25mg  daily. Previously on amlodipine which she tolerated well. It looks like amlodipine was d/c'd 1 year ago as there was concern for medication noncompliance.    Plan:  Medications:  Continue HCTZ 25mg  daily, add back amlodipine 5mg  daily  Educational resources provided: brochure  Self management tools provided: home blood pressure logbook;instructions for home blood pressure monitoring  Other plans: Instructed patient to BP log to next visit w PCP 09/12/12

## 2012-08-21 NOTE — Assessment & Plan Note (Addendum)
  Assessment:  Progress toward smoking cessation:  smoking the same amount  Barriers to progress toward smoking cessation:  lack of motivation to quit  Comments: Precontemplative stage   Plan:  Instruction/counseling given:  I counseled patient on the dangers of tobacco use and advised patient to stop smoking.  Educational resources provided:  [employer]) brochure

## 2012-08-21 NOTE — Patient Instructions (Addendum)
1. Keep taking your HCTZ 25mg  daily. Start taking amlodipine 5mg  daily as well.  2. Please keep your blood pressure journal as we discussed and bring it to your visit with your PCP 09/12/12 3. You can take ibuprofen (motrin) over the counter for headache. You can take 600mg  (three tablets) up to 4 times a day if needed. 4. We will see you again in a few weeks.   Headache  SEEK IMMEDIATE MEDICAL CARE IF:   You develop a high fever, chills, or repeated vomiting.   You faint or have difficulty with vision.   You develop unusual numbness or weakness of your arms or legs.   Relief of pain is inadequate with medication, or you develop severe pain.   You develop confusion, or neck stiffness.   You have a worsening of a headache or do not obtain relief.

## 2012-09-12 ENCOUNTER — Encounter: Payer: Self-pay | Admitting: Internal Medicine

## 2012-09-12 ENCOUNTER — Ambulatory Visit (INDEPENDENT_AMBULATORY_CARE_PROVIDER_SITE_OTHER): Payer: Self-pay | Admitting: Internal Medicine

## 2012-09-12 VITALS — BP 173/85 | HR 60 | Temp 97.3°F | Ht 61.0 in | Wt 154.7 lb

## 2012-09-12 DIAGNOSIS — Z1211 Encounter for screening for malignant neoplasm of colon: Secondary | ICD-10-CM

## 2012-09-12 DIAGNOSIS — F17200 Nicotine dependence, unspecified, uncomplicated: Secondary | ICD-10-CM

## 2012-09-12 DIAGNOSIS — Z1239 Encounter for other screening for malignant neoplasm of breast: Secondary | ICD-10-CM

## 2012-09-12 DIAGNOSIS — F329 Major depressive disorder, single episode, unspecified: Secondary | ICD-10-CM

## 2012-09-12 DIAGNOSIS — I10 Essential (primary) hypertension: Secondary | ICD-10-CM

## 2012-09-12 DIAGNOSIS — Z0000 Encounter for general adult medical examination without abnormal findings: Secondary | ICD-10-CM

## 2012-09-12 DIAGNOSIS — E785 Hyperlipidemia, unspecified: Secondary | ICD-10-CM

## 2012-09-12 MED ORDER — AMLODIPINE BESYLATE 10 MG PO TABS: 10.0000 mg | ORAL_TABLET | Freq: Every day | ORAL | Status: DC

## 2012-09-12 NOTE — Progress Notes (Addendum)
   Patient: Kelsey Hudson   MRN: [id number]  DOB: December 14, 1955  PCP: Priscella Mann, DO   Subjective:    HPI: Kelsey Hudson is a 57 y.o. female with a PMHx as outlined below, who presented to clinic today for the following:  1) HTN - Patient does check blood pressure regularly at home - with range 142-182/75-82 mmHg. Currently taking Amlodipine 5mg  (started on 08/16/12) and HCTZ 25mg . Patient misses doses 0 x per week on average. admits to occasional headaches. Denies dizziness, lightheadedness, chest pain, shortness of breath.  does request refills today.   2) HLD - currently taking Simvastatin 40mg . Patient misses doses 2 x per week on average - because she forgets to take it at bedtime. denies chest pain, difficulty breathing, palpitations, tachycardia, and muscle pains. does request refills today.  3) Tobacco abuse - patient continues to smoke 1/2 cigarettes per day and has done so for the past ~ 35 years. There  have not been prior attempts to quit. Barriers to quit (if any) include: stress.   4) Depression / Anxiety - the patient is not currently on therapy, because she stopped her Zoloft (in 02/2012) - secondary to excessive resultant sleepiness and diarrhea. She was previously also tried on Prozac (not in our clinic) because it was not providing significant improvement of depressive symptoms (despite being on it for 8 months). Now asymptomatic. Previously, had associated symptoms include: fatigue, tearful and insomnia. Denies associated suicidal ideation, homicidal ideation, suicidal thoughts with specific plan. 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   Review of Systems: Per HPI.    Current Outpatient Medications: Medication Sig  . amLODipine (NORVASC) 5 MG tablet Take 1 tablet (5 mg total) by mouth daily.  Marland Kitchen aspirin 81 MG tablet Take 1 tablet (81  mg total) by mouth daily.  . calcium-vitamin D (OSCAL WITH D) 500-200 MG-UNIT per tablet Take 1 tablet by mouth daily.  . hydrochlorothiazide (HYDRODIURIL) 25 MG tablet TAKE ONE TABLET BY MOUTH EVERY DAY  . simvastatin (ZOCOR) 40 MG tablet Take 1 tablet (40 mg total) by mouth every evening.    Allergies: No Known Allergies   Past Medical History  Diagnosis Date  . Hypertension   . Depression   . Anxiety   . Insomnia   . Tobacco abuse   . Abnormal mammogram May 27, 2009    Probable benign calcifications in the right breast - BIRADS 3  . Hyperlipidemia LDL goal < 130 06/23/2008    TG decreased 543 on 11/06 to 269 11/07     Objective:    Physical Exam: Filed Vitals:   09/12/12 1351  BP: 170/89  Pulse: 67  Temp: 97.3 F (36.3 C)     General: Vital signs reviewed and noted. Well-developed, well-nourished, in no acute distress; alert, appropriate and cooperative throughout examination.  Head: Normocephalic, atraumatic.  Throat: Oropharynx nonerythematous, no exudate appreciated.   Lungs:  Normal respiratory effort. Clear to auscultation BL without crackles or wheezes.  Heart: RRR. S1 and S2 normal without gallop, rubs. (+) murmur.  Abdomen:  BS normoactive. Soft, Nondistended, non-tender.  No masses or organomegaly.  Extremities: No pretibial edema.    Assessment/ Plan:   The patient's case and plan of care was discussed with attending physician, Dr. Margarito Liner.

## 2012-09-12 NOTE — Assessment & Plan Note (Signed)
Pertinent Labs: Liver Function Tests:    Component Value Date/Time   AST 24 02/22/2012 1523   ALT 25 02/22/2012 1523   ALKPHOS 75 02/22/2012 1523   BILITOT 0.4 02/22/2012 1523   PROT 7.4 02/22/2012 1523   ALBUMIN 4.8 02/22/2012 1523    Lipid Panel:     Component Value Date/Time   CHOL 276* 10/26/2011 1556   TRIG 303* 10/26/2011 1556   HDL 42 10/26/2011 1556   CHOLHDL 6.6 10/26/2011 1556   VLDL 61* 10/26/2011 1556   LDLCALC 173* 10/26/2011 1556     Assessment: Goal LDL (per ATP guidelines): < 130 mg/dL  Disease Control: not controlled  Progress toward goals: unable to assess  Barriers to meeting goals: Missing Simvastatin 2-3 times a week because falls asleep before taking medication.    Patient is noncompliant some of the time with prescribed medications.    Plan:  continue current medications  Advised to start taking regularly without missing doses  Will get repeat lipid panel in next 1-2 months after she starts taking the medication regularly.

## 2012-09-12 NOTE — Assessment & Plan Note (Signed)
Health Maintenance  Topic Date Due  . Pap Smear  08/21/1975  . Colonoscopy  08/21/2007  . Mammogram  05/13/2011  . Influenza Vaccine  02/03/2013  . Tetanus/tdap  09/12/2020    Assessment:  Does not know if she has her cervix still (after her hysterectomy for complex ovarian cysts) - although has previously been told that no longer needs PAPs.  Last mammo in 2010 was abnormal, with calcifications noted, BIRADs 3 category.  Has no showed for multiple mammogram scholarship appointments.   The patient was educated about the importance of followup of her abnormal mammogram and the risks of not completing followup - she agrees again to do the mammogram now.  Plan:  Encouraged to meet with Chauncey Reading regarding orange card eligibility  Mammogram scholarship given.  Stool cards given and she agrees to complete and return.

## 2012-09-12 NOTE — Assessment & Plan Note (Signed)
Assessment:  Pt is not currently taking any medications for this. Prior intolerance to Zoloft and ineffectiveness of Prozac. Denies SI/HI. States symptoms are stable at this time.  Plan:  Continue to monitor symptoms.   She will let us know if there is worsening of her mood.

## 2012-09-12 NOTE — Progress Notes (Signed)
I discussed the patient with resident Dr. Saralyn Pilar at the time of the visit.  I reviewed the history, findings, diagnosis, and treatment plans as outlined in the resident's note, and I agree with the plans as outlined in her note.

## 2012-09-12 NOTE — Patient Instructions (Signed)
General Instructions:  Please follow-up at the clinic in 1-2 months, at which time we will reevaluate your blood pressure, cholesterol (we will get labs as soon as possible) - OR, please follow-up in the clinic sooner if needed.  There have been changes in your medications:  INCREASE your amlodipine to 10mg  daily  Call the mammogram scheduling place to schedule your mammogram as soon as possible!  Please complete the stool cards and send them back to [place].   You are doing a great job in trying to become a healthier you! Keep working on this!!  If symptoms worsen, or new symptoms arise, please call the clinic or go to the ER.  PLEASE BRING ALL OF YOUR MEDICATIONS  IN A BAG TO YOUR NEXT APPOINTMENT   Treatment Goals:  Goals (1 Years of Data) as of 09/12/12         10/26/11 04/28/09     Result Component    . LDL CALC < 130  173 * mg/dL      Progress Toward Treatment Goals:  Treatment Goal 09/12/2012  Blood pressure unchanged  Stop smoking smoking less    Self Care Goals & Plans:  Self Care Goal 08/21/2012  Manage my medications take my medicines as prescribed; bring my medications to every visit; refill my medications on time  Eat healthy foods eat foods that are low in salt; eat more vegetables; drink diet soda or water instead of juice or soda; eat baked foods instead of fried foods      LIFESTYLE TIPS TO HELP WITH YOUR BLOOD PRESSURE CONTROL  WEIGHT REDUCTION:  Strategies: A healthy weight loss program includes:  A calorie restricted diet based on individual calorie needs.   Increased physical activity (exercise).  An exercise program is just as important as the right low-calorie diet.    An unhealthy weight loss program includes:  Fasting.   Fad diets.   Supplements and drugs.  These choices do not succeed in long-term weight control.   Home Care Instructions: To help you make the needed dietary changes:   Exercise and perform physical activity as  directed by your caregiver.   Keep a daily record of everything you eat. There are many free websites to help you with this. It may be helpful to measure your foods so you can determine if you are eating the correct portion sizes.   Use low-calorie cookbooks or take special cooking classes.   Avoid alcohol. Drink more water and drinks with no calories.   Take vitamins and supplements only as recommended by your caregiver.   Weight loss support groups, Registered Dieticians, counselors, and stress reduction education can also be very helpful.   ________________________________________________________________________  DASH DIET:  The DASH diet stands for "Dietary Approaches to Stop Hypertension." It is a healthy eating plan that has been shown to reduce high blood pressure (hypertension) in as little as 14 days, while also possibly providing other significant health benefits. These other health benefits include reducing the risk of breast cancer after menopause and reducing the risk of type 2 diabetes, heart disease, colon cancer, and stroke. Health benefits also include weight loss and slowing kidney failure in patients with chronic kidney disease.   Diet guidelines: Limit salt (sodium). Your diet should contain less than 1500 mg of sodium daily.  Limit refined or processed carbohydrates. Your diet should include mostly whole grains. Desserts and added sugars should be used sparingly.  Include small amounts of heart-healthy fats. These types of fats  include nuts, oils, and tub margarine. Limit saturated and trans fats. These fats have been shown to be harmful in the body.   Choosing Foods: The following food groups are based on a 2000 calorie diet. See your Registered Dietitian for individual calorie needs.  Grains and Grain Products (6 to 8 servings daily)  Eat More Often: Whole-wheat bread, brown rice, whole-grain or wheat pasta, quinoa, popcorn without added fat or salt (air popped).   Eat Less Often: White bread, white pasta, white rice, cornbread.  Vegetables (4 to 5 servings daily)  Eat More Often: Fresh, frozen, and canned vegetables. Vegetables may be raw, steamed, roasted, or grilled with a minimal amount of fat.  Eat Less Often/Avoid: Creamed or fried vegetables. Vegetables in a cheese sauce.  Fruit (4 to 5 servings daily)  Eat More Often: All fresh, canned (in natural juice), or frozen fruits. Dried fruits without added sugar. One hundred percent fruit juice ( cup [237 mL] daily).  Eat Less Often: Dried fruits with added sugar. Canned fruit in light or heavy syrup.  [employer], Fish, and Poultry (2 servings or less daily. One serving is 3 to 4 oz [85-114 g]).  Eat More Often: Ninety percent or leaner ground beef, tenderloin, sirloin. Round cuts of beef, chicken breast, [place] breast. All fish. Grill, bake, or broil your meat. Nothing should be fried.  Eat Less Often/Avoid: Fatty cuts of meat, [place], or chicken leg, thigh, or wing. Fried cuts of meat or fish.  Dairy (2 to 3 servings)  Eat More Often: Low-fat or fat-free milk, low-fat plain or light yogurt, reduced-fat or part-skim cheese.  Eat Less Often/Avoid: Milk (whole, 2%, skim, or chocolate). Whole milk yogurt. Full-fat cheeses.  Nuts, Seeds, and Legumes (4 to 5 servings per week)  Eat More Often: All without added salt.  Eat Less Often/Avoid: Salted nuts and seeds, canned beans with added salt.  Fats and Sweets (limited)  Eat More Often: Vegetable oils, tub margarines without trans fats, sugar-free gelatin. Mayonnaise and salad dressings.  Eat Less Often/Avoid: Coconut oils, palm oils, butter, stick margarine, cream, half and half, cookies, candy, pie.   ________________________________________________________________________  Smoking Cessation Tips 1-800-QUIT-NOW  This document explains the best ways for you to quit smoking and new treatments to help. It lists new medicines that can double or triple your  chances of quitting and quitting for good. It also considers ways to avoid relapses and concerns you may have about quitting, including weight gain.   Nicotine: A Powerful Addiction If you have tried to quit smoking, you know how hard it can be. It is hard because nicotine is a very addictive drug. For some people, it can be as addictive as heroin or cocaine. Usually, people make 2 or 3 tries, or more, before finally being able to quit. Each time you try to quit, you can learn about what helps and what hurts. Quitting takes hard work and a lot of effort, but you can quit smoking.   Quitting smoking is one of the most important things you will ever do You will live longer, feel better, and live better.  The impact on your body of quitting smoking is felt almost immediately:   Five keys to quitting: Studies have shown that these 5 steps will help you quit smoking and quit for good. You have the best chances of quitting if you use them together:   1. GET READY  Set a quit date.  Change your environment.  Get rid of  ALL cigarettes, ashtrays, matches, and lighters in your home, car, and place of work.  Do not let people smoke in your home.  Review your past attempts to quit. Think about what worked and what did not.  Once you quit, do not smoke. NOT EVEN A PUFF!   2. GET SUPPORT AND ENCOURAGEMENT  Tell your family, friends, and coworkers that you are going to quit and need their support. Ask them not to smoke around you.  Get individual, group, or telephone counseling and support.  Many smokers find one or more of the many self-help books available useful in helping them quit and stay off tobacco.   3. LEARN NEW SKILLS AND BEHAVIORS  Try to distract yourself from urges to smoke. Talk to someone, go for a walk, or occupy your time with a task.  When you first try to quit, change your routine. Take a different route to work. Drink tea instead of coffee. Eat breakfast in a different place.  Do  something to reduce your stress. Take a hot bath, exercise, or read a book.  Plan something enjoyable to do every day. Reward yourself for not smoking.  Explore interactive web-based programs that specialize in helping you quit.   4. GET MEDICINE AND USE IT CORRECTLY .  Medicines can help you stop smoking and decrease the urge to smoke. Combining medicine with the above behavioral methods and support can quadruple your chances of successfully quitting smoking.  Talk with your doctor about these options.  5. BE PREPARED FOR RELAPSE OR DIFFICULT SITUATIONS  Most relapses occur within the first 3 months after quitting. Do not be discouraged if you start smoking again. Remember, most people try several times before they finally quit.  You may have symptoms of withdrawal because your body is used to nicotine. You may crave cigarettes, be irritable, feel very hungry, cough often, get headaches, or have difficulty concentrating.  The withdrawal symptoms are only temporary. They are strongest when you first quit, but they will go away within 10 to 14 days.   Quitting takes hard work and a lot of effort, but you can quit smoking.   FOR MORE INFORMATION  Smokefree.gov (http://www.davis-sullivan.com/) provides free, accurate, evidence-based information and professional assistance to help support the immediate and long-term needs of people trying to quit smoking.  Document Released: 05/16/2001 Document Re-Released: 11/09/2009  [facility] Patient Information 2011 [place], [place].

## 2012-09-12 NOTE — Assessment & Plan Note (Signed)
  Assessment: 1. The patient was counseled on the dangers of tobacco use, which include, but are not limited to cardiovascular disease, increased cancer risk of multiple types of cancer, COPD, peripheral vascular disease, strokes. 2. She was also counseled on the benefits of smoking cessation. 3. Progress toward smoking cessation:  stress 4. Barriers to progress toward smoking cessation:  smoking less  Plan: 1. Instruction/counseling given: The patient was firmly advised to quit and referred to a tobacco cessation program.   2. We also reviewed strategies to maximize success, including:  Removing cigarettes and smoking materials from environment  Stress management  Substitution of other forms of reinforcement Support of family/friends.  Selecting a quit date. 3. Educational resources provided:   4. Self management tools provided:  handout 5. Medications to assist with smoking cessation: None 6. Patient agreed to the following self-care plans for smoking cessation:  Decreasing tobacco use

## 2012-09-12 NOTE — Assessment & Plan Note (Signed)
Pertinent Data: BP Readings from Last 3 Encounters:  09/12/12 170/89  08/21/12 162/96  02/22/12 138/82    Basic Metabolic Panel:    Component Value Date/Time   NA 138 02/22/2012 1523   K 4.1 02/22/2012 1523   CL 102 02/22/2012 1523   CO2 30 02/22/2012 1523   BUN 8 02/22/2012 1523   CREATININE 0.69 02/22/2012 1523   CREATININE 0.84 03/15/2009 2012   GLUCOSE 72 02/22/2012 1523   CALCIUM 10.5 02/22/2012 1523    Assessment: Disease Control: moderately elevated  Progress toward goals: unchanged  Barriers to meeting goals: no barriers identified and contniued smoking    Taking Amlodipine 5mg  and HCTZ 25mg  at presenty.    Patient is compliant most of the time with prescribed medications.   Plan:  Escalate Amlodipine to 10mg  daily and continue HCTZ.  If persistently high next visit, will plan to add Lisinopril or other ACE-I and do a combo pill with HCTZ for cost reasons.  BMET in next 2-3 months  Educational resources provided:    Self management tools provided:  handout

## 2012-09-23 ENCOUNTER — Ambulatory Visit (HOSPITAL_COMMUNITY)
Admission: RE | Admit: 2012-09-23 | Discharge: 2012-09-23 | Disposition: A | Discharge status: Home / Self Care | Payer: Self-pay | Source: Ambulatory Visit | Attending: Internal Medicine | Admitting: Internal Medicine

## 2012-09-23 DIAGNOSIS — Z1239 Encounter for other screening for malignant neoplasm of breast: Secondary | ICD-10-CM

## 2012-09-23 DIAGNOSIS — Z0000 Encounter for general adult medical examination without abnormal findings: Secondary | ICD-10-CM

## 2012-09-24 ENCOUNTER — Other Ambulatory Visit: Payer: Self-pay | Admitting: Internal Medicine

## 2012-09-24 ENCOUNTER — Telehealth: Payer: Self-pay | Admitting: *Deleted

## 2012-09-24 DIAGNOSIS — R928 Other abnormal and inconclusive findings on diagnostic imaging of breast: Secondary | ICD-10-CM

## 2012-09-24 NOTE — Telephone Encounter (Signed)
Please call her at [phone]

## 2012-09-24 NOTE — Telephone Encounter (Addendum)
Hi Helen,   As we discussed, I informed the patient during our visit that she is due for a mammogram given her abnormal mammo in the past. She has previously been given scholarship that she has no-showed to. As most recently scholarship mammos must be arranged by the patient, during our visit, we gave the patient a handout with the appropriate contact information that she was supposed to call.   I did not order the ultrasound that she has ordered - this was done by the physician reading the mammograms, this is organized through the breast center radiologists.   In regards to her blood pressure, I had last seen the patient in 10/2011 for this issue. She had been out of her amlodipine for 2 weeks at least, and was inconsistently taking her HCTZ, therefore, as cost was an issue for her - we together decided to stop her Amlodipine to assess if BP could be controlled on Amlodipine alone. She came back in 02/2012 and BP was controlled at that time, therefore, I did not restart her amlodipine. She returned in 08/2012 and had single elevated BP elevation and it was decided by the provider to restart her amlodipine.  I just wanted to clarify some of the concerns that she raised.  Thank you! - Johnette Abraham, D.O., 09/24/2012, 5:08 PM

## 2012-09-24 NOTE — Telephone Encounter (Signed)
Scholarship At [employer] they told her she had to fill out a paper for the scholarship, she seems to be upset about this. She is also upset that she was sent to [facility] instead of the breast ctr for this procedure. i explained the breast ctr at [facility] is part of the [facility] but she states no it's not and it cost her more money because it had to be sent to [facility] to be read, she is also upset that dr Dorothyann Gibbs has ordered an ultrasound now because she told the people at [facility] it's the same as before and she doesn't need to have more procedures because she can't afford this, i tried to explain that she does not have to agree to have any procedure that she doesn't want to have. She is also upset that dr Thad Ranger has cost her money and time because she took her off her BP med and now she is back on it, she states dr Thad Ranger told her at last visit "we need to get this BP under control because you could have a stroke", she states dr Thad Ranger has cost her so much money coming and going to doctors because of her mistake, it is noted that in 2013 pt had 2 visits to [facility] and so far in 2014, 2 visits to [facility]. She states she wants to speak with dr Thad Ranger "boss" today. She also states" she is sorry because she wanted to give dr Thad Ranger a talking to and just let her know she was fine til she started messing with her healthcare, she says she is sorry i had to listen to it" i told her that was fine and i would pass the message along This is sent to dr Josem Kaufmann and dr Thad Ranger

## 2012-09-25 NOTE — Telephone Encounter (Signed)
I have reviewed Dr. Saralyn Pilar clarification and there does not appear to be anything done inappropriately from [facility] standpoint.  I therefore have nothing to add to what [facility] has already tried to do for Ms. Haldeman.

## 2012-10-09 ENCOUNTER — Telehealth (HOSPITAL_COMMUNITY): Payer: Self-pay | Admitting: *Deleted

## 2012-10-09 NOTE — Telephone Encounter (Signed)
Returned patient's call. Patient states does not have income information for appointment. Advised patient that would need letter stating lives with significant other and employee letter stating rate of pay and weekly amount. Patient will try to get this information for appointment. If not, will schedule appointment with the [facility] and pay out of pocket.

## 2012-10-14 ENCOUNTER — Other Ambulatory Visit: Payer: Self-pay | Admitting: *Deleted

## 2012-10-14 DIAGNOSIS — R921 Mammographic calcification found on diagnostic imaging of breast: Secondary | ICD-10-CM

## 2012-10-15 ENCOUNTER — Encounter (HOSPITAL_COMMUNITY): Payer: Self-pay

## 2012-10-15 ENCOUNTER — Ambulatory Visit (HOSPITAL_COMMUNITY)
Admission: RE | Admit: 2012-10-15 | Discharge: 2012-10-15 | Disposition: A | Discharge status: Home / Self Care | Payer: Self-pay | Source: Ambulatory Visit | Attending: Obstetrics and Gynecology | Admitting: Obstetrics and Gynecology

## 2012-10-15 VITALS — BP 120/64 | Temp 98.4°F | Ht 61.0 in | Wt 142.4 lb

## 2012-10-15 DIAGNOSIS — Z01419 Encounter for gynecological examination (general) (routine) without abnormal findings: Secondary | ICD-10-CM

## 2012-10-15 NOTE — Patient Instructions (Signed)
Taught patient how to perform BSE. Let her know [facility] will cover Pap smears every 3 years unless has a history of abnormal Pap smears. Referred patient to the [facility] for right breast diagnostic mammogram and ultrasound per recommendation. Appointment scheduled for Thursday, Oct 24, 2012 at 0800. Patient aware of appointment and will reschedule if cannot make. Let patient know will follow up with her within the next couple weeks with results by phone. Patient verbalized understanding.

## 2012-10-15 NOTE — Addendum Note (Signed)
Encounter addended by: Saintclair Halsted, RN on: 10/15/2012  3:13 PM<BR>     Documentation filed: Visit Diagnoses, Notes Section

## 2012-10-15 NOTE — Progress Notes (Addendum)
Patient referred to [facility] by the [facility] due to needing additional imaging of the right breast. Screening mammogram completed at [facility] 09/23/12.  Pap Smear:  Completed Pap smear today. Per patient last Pap smear was in 2005 and normal. Per patient no history of an abnormal Pap smear. No Pap smear results in EPIC. Marland Kitchen  Physical exam: Breasts Breasts symmetrical. No skin abnormalities bilateral breasts. No nipple retraction bilateral breasts. No nipple discharge bilateral breasts. No lymphadenopathy. No lumps palpated bilateral breasts. No complaints of pain or tenderness on exam.  Referred patient to the [facility] for right breast diagnostic mammogram and ultrasound per recommendation. Appointment scheduled for Thursday, Oct 24, 2012 at 0800.       Pelvic/Bimanual   Ext Genitalia No lesions, no swelling and no discharge observed on external genitalia.         Vagina Vagina pink and normal texture. No lesions or discharge observed in vagina.          Cervix Cervix is present. Cervix pink and of normal texture. No discharge observed on cervix.    Uterus Uterus is absent due to a history of a supracervical hysterectomy.    Adnexae Bilateral ovaries are absent due to a history of a supracervical hysterectomy.    Rectovaginal No rectal exam completed today since patient had no rectal complaints. Large hemorrhoid observed on rectal area.

## 2012-10-24 ENCOUNTER — Ambulatory Visit
Admission: RE | Admit: 2012-10-24 | Discharge: 2012-10-24 | Disposition: A | Discharge status: Home / Self Care | Payer: No Typology Code available for payment source | Source: Ambulatory Visit | Attending: Obstetrics and Gynecology | Admitting: Obstetrics and Gynecology

## 2012-10-24 DIAGNOSIS — R921 Mammographic calcification found on diagnostic imaging of breast: Secondary | ICD-10-CM

## 2012-11-11 ENCOUNTER — Telehealth (HOSPITAL_COMMUNITY): Payer: Self-pay | Admitting: *Deleted

## 2012-11-11 NOTE — Telephone Encounter (Signed)
Telephoned patient at work # and discussed results of negative pap smear. Next pap due in 3 years. Patient voiced understanding.

## 2013-02-05 ENCOUNTER — Other Ambulatory Visit: Payer: Self-pay | Admitting: *Deleted

## 2013-02-11 ENCOUNTER — Other Ambulatory Visit: Payer: Self-pay | Admitting: Internal Medicine

## 2013-02-11 MED ORDER — HYDROCHLOROTHIAZIDE 25 MG PO TABS: 25.0000 mg | ORAL_TABLET | Freq: Every day | ORAL | Status: DC

## 2013-02-11 NOTE — Telephone Encounter (Signed)
Pls sch appt Dr E in early Nov for CC appt

## 2013-02-20 ENCOUNTER — Encounter: Payer: Self-pay | Admitting: Internal Medicine

## 2013-04-15 ENCOUNTER — Encounter: Payer: Self-pay | Admitting: Internal Medicine

## 2013-04-15 ENCOUNTER — Ambulatory Visit (INDEPENDENT_AMBULATORY_CARE_PROVIDER_SITE_OTHER): Payer: Self-pay | Admitting: Internal Medicine

## 2013-04-15 VITALS — BP 135/85 | HR 70 | Temp 97.4°F | Ht 61.0 in | Wt 137.1 lb

## 2013-04-15 DIAGNOSIS — E785 Hyperlipidemia, unspecified: Secondary | ICD-10-CM

## 2013-04-15 DIAGNOSIS — Z0000 Encounter for general adult medical examination without abnormal findings: Secondary | ICD-10-CM

## 2013-04-15 DIAGNOSIS — I10 Essential (primary) hypertension: Secondary | ICD-10-CM

## 2013-04-15 DIAGNOSIS — F17200 Nicotine dependence, unspecified, uncomplicated: Secondary | ICD-10-CM

## 2013-04-15 LAB — COMPLETE METABOLIC PANEL WITH GFR
Albumin: 4.8 g/dL (ref 3.5–5.2)
Alkaline Phosphatase: 85 U/L (ref 39–117)
CO2: 30 mEq/L (ref 19–32)
GFR, Est African American: >89 mL/min
GFR, Est Non African American: >89 mL/min
Glucose, Bld: 94 mg/dL (ref 70–99)
Potassium: 4.1 mEq/L (ref 3.5–5.3)
Sodium: 140 mEq/L (ref 135–145)
Total Protein: 7.6 g/dL (ref 6.0–8.3)

## 2013-04-15 LAB — LIPID PANEL: Total CHOL/HDL Ratio: 4.3 Ratio

## 2013-04-15 MED ORDER — SIMVASTATIN 40 MG PO TABS: 40.0000 mg | ORAL_TABLET | Freq: Every evening | ORAL | Status: DC

## 2013-04-15 MED ORDER — ASPIRIN 81 MG PO TABS: 81.0000 mg | ORAL_TABLET | Freq: Every day | ORAL | Status: DC

## 2013-04-15 MED ORDER — AMLODIPINE BESYLATE 10 MG PO TABS: 10.0000 mg | ORAL_TABLET | Freq: Every day | ORAL | Status: DC

## 2013-04-15 MED ORDER — HYDROCHLOROTHIAZIDE 25 MG PO TABS: 25.0000 mg | ORAL_TABLET | Freq: Every day | ORAL | Status: DC

## 2013-04-15 NOTE — Assessment & Plan Note (Addendum)
Patient reports compliance with her meds. Checks her blood pressure at home, Systolic usually in the 130s, diastolic- 80s. Patient still smokes, she is not ready to quit smoking.  Vitals - 1 value per visit 04/15/2013 10/15/2012 09/12/2012  SYSTOLIC 135 120 098  DIASTOLIC 85 64 85   Disease Control: At goal.  Progress toward goals: Achieved.   Plan- Continue current antiHTN- Amlodipine- 10mg  dly and HCT- 25mg  dly.

## 2013-04-15 NOTE — Assessment & Plan Note (Signed)
Last LDL- 10/26/11- 173. Calculated 10 year risk of ASCVD- 12.7%, requiring a statin- moderate intensity.   Assessment - Continue statin- 40mg  daily - Lipid profile check today.

## 2013-04-15 NOTE — Progress Notes (Signed)
Patient ID: YARITHZA MINK, female   DOB: 1955/12/25, 57 y.o.   MRN: [id number]   Subjective:   Patient ID: ARDIE DRAGOO female   DOB: [birth year] 57 y.o.   MRN: [id number]  HPI: Ms.Gabriell JAKIRA MCFADDEN is a 57 y.o. PMH of hypertension, hyperlipidemia and depression and anxiety.  No complaints today, presented for routine check up and refill of her meds. Please see problem based chatting for review of patient Chronic medical problems.  Past Medical History  Diagnosis Date  . Hypertension   . Depression   . Anxiety   . Insomnia   . Tobacco abuse   . Abnormal mammogram 05/2009    Probable benign calcifications in the right breast - BIRADS 3  . Hyperlipidemia LDL goal < 130 06/23/2008    TG decreased 543 on 11/06 to 269 11/07   Current Outpatient Prescriptions  Medication Sig Dispense Refill  . amLODipine (NORVASC) 10 MG tablet Take 1 tablet (10 mg total) by mouth daily.  30 tablet  11  . aspirin 81 MG tablet Take 1 tablet (81 mg total) by mouth daily.  30 tablet    . calcium-vitamin D (OSCAL WITH D) 500-200 MG-UNIT per tablet Take 1 tablet by mouth daily.  30 tablet  11  . hydrochlorothiazide (HYDRODIURIL) 25 MG tablet Take 1 tablet (25 mg total) by mouth daily.  90 tablet  4  . simvastatin (ZOCOR) 40 MG tablet Take 1 tablet (40 mg total) by mouth every evening.  30 tablet  11   No current facility-administered medications for this visit.   Family History  Problem Relation Age of Onset  . Diabetes Mother   . COPD Mother   . Heart disease Mother     CHF  . Diabetes Father   . Diabetes Sister   . Heart disease Sister     died of enlarged heart  . Endometriosis Sister    History   Social History  . Marital Status: Divorced    Spouse Name: N/A    Number of Children: 1  . Years of Education: 10th grade   Occupational History  . part-time     caregiver for elderly gentleman   Social History Main Topics  . Smoking status: Current Some Day Smoker -- 1.00 packs/day for 25  years    Types: Cigarettes  . Smokeless tobacco: Never Used     Comment: Can do herself  . Alcohol Use: Yes     Comment: occasional - 1x per month.  . Drug Use: No  . Sexual Activity: Not Currently    Birth Control/ Protection: Surgical   Other Topics Concern  . None   Social History Narrative   Lives in [place], [place] with partner.   Review of Systems: CONSTITUTIONAL- No Fever, no change in appetite. HEAD- No Headache, or dizziness. RESPIRATORY- No Cough, or SOB. CARDIAC- No Palpitations or chest pain. GI- No vomiting, diarrhoea, constipation, abd pain. URINARY- No dysuria, or frequency. NEUROLOGIC- No Numbness, or syncope. [facility]- says her mood has been fine, no palpitations or excessive sweating or panic attacks.  Objective:  Physical Exam: Filed Vitals:   04/15/13 1603  BP: 135/85  Pulse: 70  Temp: 97.4 F (36.3 C)  TempSrc: Oral  Height: 5\' 1"  (1.549 m)  Weight: 137 lb 1.6 oz (62.188 kg)  SpO2: 99%   GENERAL- alert, co-operative, appears as stated age, not in any distress. HEENT- Atraumatic, normocephalic, PERRL, EOMI, oral mucosa appears moist, no cervical LN enlargement, thyroid does  not appear enlarged. CARDIAC- RRR, no murmurs, rubs or gallops. RESP- Moving equal volumes of air, and clear to auscultation bilaterally. ABDOMEN- Soft,non tender, no palpable masses or organomegaly, bowel sounds present. BACK- Normal curvature of the spine, No tenderness along the vertebrae, no CVA tenderness. NEURO- No obvious Cr N abnormality. EXTREMITIES- pulse 2+, symmetric, no pedal edema. PSYCH- Normal mood and affect, appropriate thought content and speech.  Assessment & Plan:  The patient's case and plan of care was discussed with attending physician, Dr. Sharlene Motts.  Please see problem based chatting for Assessment and plan.

## 2013-04-15 NOTE — Assessment & Plan Note (Signed)
Not interested in getting a Flu shot, Had the pap smear this year. Next mammogram due in 2016. Doesnt have insurance, so hasn't been able to get a colonoscopy done. Working on getting the orange card.

## 2013-04-15 NOTE — Assessment & Plan Note (Signed)
Patient said she is not ready to quit smoking.  Assessment-  Talked to pt about the benefits of quitting smoking and the risks in continuing to smoke. Revisit issue again at a later date.

## 2013-04-15 NOTE — Patient Instructions (Signed)
We will not be changing any of your medications today. Continue the good work, your blood pressure is better controlled today. When you are ready, we have some materials and options to help you if you will like to quit smoking, like the nicotine patch.

## 2013-04-18 NOTE — Progress Notes (Signed)
I saw and evaluated the patient.  I personally confirmed the key portions of the history and exam documented by Dr. Emokpae and I reviewed pertinent patient test results.  The assessment, diagnosis, and plan were formulated together and I agree with the documentation in the resident's note. 

## 2013-04-20 ENCOUNTER — Emergency Department (INDEPENDENT_AMBULATORY_CARE_PROVIDER_SITE_OTHER)
Admission: EM | Admit: 2013-04-20 | Discharge: 2013-04-20 | Disposition: A | Discharge status: Home / Self Care | Payer: Self-pay | Source: Home / Self Care | Attending: Family Medicine | Admitting: Family Medicine

## 2013-04-20 ENCOUNTER — Emergency Department (INDEPENDENT_AMBULATORY_CARE_PROVIDER_SITE_OTHER): Payer: Self-pay

## 2013-04-20 ENCOUNTER — Encounter (HOSPITAL_COMMUNITY): Payer: Self-pay | Admitting: Emergency Medicine

## 2013-04-20 DIAGNOSIS — S63502A Unspecified sprain of left wrist, initial encounter: Secondary | ICD-10-CM

## 2013-04-20 DIAGNOSIS — S63509A Unspecified sprain of unspecified wrist, initial encounter: Secondary | ICD-10-CM

## 2013-04-20 NOTE — ED Provider Notes (Signed)
CSN: [id number]     Arrival date & time 04/20/13  1105 History   First MD Initiated Contact with Patient 04/20/13 1225     Chief Complaint  Patient presents with  . Wrist Pain   (Consider location/radiation/quality/duration/timing/severity/associated sxs/prior Treatment) Patient is a 57 y.o. female presenting with wrist pain. The history is provided by the patient.  Wrist Pain This is a new problem. The current episode started 2 days ago (fell on fri eve playing with gson, trying to catch herself.). The problem has not changed since onset.The symptoms are aggravated by bending and twisting.    Past Medical History  Diagnosis Date  . Hypertension   . Depression   . Anxiety   . Insomnia   . Tobacco abuse   . Abnormal mammogram 05/2009    Probable benign calcifications in the right breast - BIRADS 3  . Hyperlipidemia LDL goal < 130 06/23/2008    TG decreased 543 on 11/06 to 269 11/07   Past Surgical History  Procedure Laterality Date  . Subtotal abdominal hysterectomy with bilateral salpingo-oophorectomy  2003    for complex ovarian cysts.   Family History  Problem Relation Age of Onset  . Diabetes Mother   . COPD Mother   . Heart disease Mother     CHF  . Diabetes Father   . Diabetes Sister   . Heart disease Sister     died of enlarged heart  . Endometriosis Sister    History  Substance Use Topics  . Smoking status: Current Some Day Smoker -- 1.00 packs/day for 25 years    Types: Cigarettes  . Smokeless tobacco: Never Used     Comment: Can do herself  . Alcohol Use: Yes     Comment: occasional - 1x per month.   OB History   Grav Para Term Preterm Abortions TAB SAB Ect Mult Living   1 1 1       1      Review of Systems  Constitutional: Negative.   Musculoskeletal: Positive for joint swelling. Negative for back pain and gait problem.    Allergies  Codeine  Home Medications   Current Outpatient Rx  Name  Route  Sig  Dispense  Refill  . amLODipine  (NORVASC) 10 MG tablet   Oral   Take 1 tablet (10 mg total) by mouth daily.   30 tablet   11     Please Discontinue all remaining Amlodipine 5mg  pr ...   . aspirin 81 MG tablet   Oral   Take 1 tablet (81 mg total) by mouth daily.   30 tablet      . calcium-vitamin D (OSCAL WITH D) 500-200 MG-UNIT per tablet   Oral   Take 1 tablet by mouth daily.   30 tablet   11   . hydrochlorothiazide (HYDRODIURIL) 25 MG tablet   Oral   Take 1 tablet (25 mg total) by mouth daily.   90 tablet   4   . simvastatin (ZOCOR) 40 MG tablet   Oral   Take 1 tablet (40 mg total) by mouth every evening.   30 tablet   11    BP 163/80  Pulse 58  Temp(Src) 98 F (36.7 C) (Oral)  Resp 18  SpO2 94% Physical Exam  Nursing note and vitals reviewed. Constitutional: She is oriented to person, place, and time. She appears well-developed and well-nourished.  Musculoskeletal: She exhibits tenderness.       Left wrist: She exhibits decreased  range of motion, tenderness, bony tenderness, swelling and deformity.       Arms: Neurological: She is alert and oriented to person, place, and time.  Skin: Skin is warm and dry.    ED Course  Procedures (including critical care time) Labs Review Labs Reviewed - No data to display Imaging Review Dg Wrist Complete Left  04/20/2013   CLINICAL DATA:  Larey Seat on the wrist  EXAM: LEFT WRIST - COMPLETE 3+ VIEW  COMPARISON:  None.  FINDINGS: There is no evidence of fracture or dislocation. There is no evidence of arthropathy or other focal bone abnormality. Soft tissues are unremarkable.  IMPRESSION: No acute osseous injury of the left wrist.   Electronically Signed   By: Elige Ko   On: 04/20/2013 12:53    EKG Interpretation     Ventricular Rate:    PR Interval:    QRS Duration:   QT Interval:    QTC Calculation:   R Axis:     Text Interpretation:              MDM  X-rays reviewed and report per radiologist.     Linna Hoff, MD 04/20/13  1314

## 2013-04-20 NOTE — ED Notes (Signed)
Pt c/o left wrist injury Friday night. Pt reports she was trying not to fall and landed on her wrist. Pt has been using ice and ibuprofen with no relief. Wrist is swollen and red and hurts with ROM. Pt is alert and oriented and in no acute distress.

## 2014-03-03 ENCOUNTER — Ambulatory Visit (INDEPENDENT_AMBULATORY_CARE_PROVIDER_SITE_OTHER): Payer: Self-pay | Admitting: Internal Medicine

## 2014-03-03 ENCOUNTER — Encounter: Payer: Self-pay | Admitting: Internal Medicine

## 2014-03-03 VITALS — BP 156/92 | HR 72 | Temp 98.0°F | Ht 60.0 in | Wt 148.7 lb

## 2014-03-03 DIAGNOSIS — I10 Essential (primary) hypertension: Secondary | ICD-10-CM

## 2014-03-03 DIAGNOSIS — Z0000 Encounter for general adult medical examination without abnormal findings: Secondary | ICD-10-CM

## 2014-03-03 NOTE — Patient Instructions (Signed)
General Instructions:  Please come back in a month with your BP readings, try to check it at least 1ce a day.  If it is elevated we will add a new medication, which will be combined into one pill with th eone you are taking presently.  Remember to work on your exercise and weightloss. Also avoid fried foods, bake them instead.   Please bring your medicines with you each time you come to clinic.  Medicines may include prescription medications, over-the-counter medications, herbal remedies, eye drops, vitamins, or other pills.   Exercise to Lose Weight Exercise and a healthy diet may help you lose weight. Your doctor may suggest specific exercises. EXERCISE IDEAS AND TIPS  Choose low-cost things you enjoy doing, such as walking, bicycling, or exercising to workout videos.  Take stairs instead of the elevator.  Walk during your lunch break.  Park your car further away from work or school.  Go to a gym or an exercise class.  Start with 5 to 10 minutes of exercise each day. Build up to 30 minutes of exercise 4 to 6 days a week.  Wear shoes with good support and comfortable clothes.  Stretch before and after working out.  Work out until you breathe harder and your heart beats faster.  Drink extra water when you exercise.  Do not do so much that you hurt yourself, feel dizzy, or get very short of breath. Exercises that burn about 150 calories:  Running 1  miles in 15 minutes.  Playing volleyball for 45 to 60 minutes.  Washing and waxing a car for 45 to 60 minutes.  Playing touch football for 45 minutes.  Walking 1  miles in 35 minutes.  Pushing a stroller 1  miles in 30 minutes.  Playing basketball for 30 minutes.  Raking leaves for 30 minutes.  Bicycling 5 miles in 30 minutes.  Walking 2 miles in 30 minutes.  Dancing for 30 minutes.  Shoveling snow for 15 minutes.  Swimming laps for 20 minutes.  Walking up stairs for 15 minutes.  Bicycling 4 miles in 15  minutes.  Gardening for 30 to 45 minutes.  Jumping rope for 15 minutes.  Washing windows or floors for 45 to 60 minutes.     Cholesterol Cholesterol is a white, waxy, fat-like substance needed by your body in small amounts. The liver makes all the cholesterol you need. Cholesterol is carried from the liver by the blood through the blood vessels. Deposits of cholesterol (plaque) may build up on blood vessel walls. These make the arteries narrower and stiffer. Cholesterol plaques increase the risk for heart attack and stroke.  You cannot feel your cholesterol level even if it is very high. The only way to know it is high is with a blood test. Once you know your cholesterol levels, you should keep a record of the test results. Work with your health care provider to keep your levels in the desired range.  WHAT DO THE RESULTS MEAN?  Total cholesterol is a rough measure of all the cholesterol in your blood.   LDL is the so-called bad cholesterol. This is the type that deposits cholesterol in the walls of the arteries. You want this level to be low.   HDL is the good cholesterol because it cleans the arteries and carries the LDL away. You want this level to be high.  Triglycerides are fat that the body can either burn for energy or store. High levels are closely linked to heart disease.  WHAT ARE THE DESIRED LEVELS OF CHOLESTEROL?  Total cholesterol below 200.   LDL below 100 for people at risk, below 70 for those at very high risk.   HDL above 50 is good, above 60 is best.   Triglycerides below 150.  HOW CAN I LOWER MY CHOLESTEROL?  Diet. Follow your diet programs as directed by your health care provider.   Choose fish or white meat chicken and [place], roasted or baked. Limit fatty cuts of red meat, fried foods, and processed meats, such as sausage and lunch meats.   Eat lots of fresh fruits and vegetables.  Choose whole grains, beans, pasta, potatoes, and cereals.   Use  only small amounts of olive, corn, or canola oils.   Avoid butter, mayonnaise, shortening, or palm kernel oils.  Avoid foods with trans fats.   Drink skim or nonfat milk and eat low-fat or nonfat yogurt and cheeses. Avoid whole milk, cream, ice cream, egg yolks, and full-fat cheeses.   Healthy desserts include angel food cake, ginger snaps, animal crackers, hard candy, popsicles, and low-fat or nonfat frozen yogurt. Avoid pastries, cakes, pies, and cookies.   Exercise. Follow your exercise programs as directed by your health care provider.   A regular program helps decrease LDL and raise HDL.   A regular program helps with weight control.   Do things that increase your activity level like gardening, walking, or taking the stairs. Ask your health care provider about how you can be more active in your daily life.   Medicine. Take medicine only as directed by your health care provider.   Medicine may be prescribed by your health care provider to help lower cholesterol and decrease the risk for heart disease.   If you have several risk factors, you may need medicine even if your levels are normal.

## 2014-03-03 NOTE — Progress Notes (Signed)
Patient ID: Kelsey Hudson, female   DOB: 29-Nov-1955, 58 y.o.   MRN: [id number]   Subjective:   Patient ID: Kelsey Hudson female   DOB: 1956-02-07 58 y.o.   MRN: [id number]  HPI: [name] is a 58 y.o. with PMH listed below presented  For routine follow up.   Past Medical History  Diagnosis Date  . Hypertension   . Depression   . Anxiety   . Insomnia   . Tobacco abuse   . Abnormal mammogram 05/2009    Probable benign calcifications in the right breast - BIRADS 3  . Hyperlipidemia LDL goal < 130 06/23/2008    TG decreased 543 on 11/06 to 269 11/07   Current Outpatient Prescriptions  Medication Sig Dispense Refill  . amLODipine (NORVASC) 10 MG tablet Take 1 tablet (10 mg total) by mouth daily.  30 tablet  11  . aspirin 81 MG tablet Take 1 tablet (81 mg total) by mouth daily.  30 tablet    . calcium-vitamin D (OSCAL WITH D) 500-200 MG-UNIT per tablet Take 1 tablet by mouth daily.  30 tablet  11  . hydrochlorothiazide (HYDRODIURIL) 25 MG tablet Take 1 tablet (25 mg total) by mouth daily.  90 tablet  4  . simvastatin (ZOCOR) 40 MG tablet Take 1 tablet (40 mg total) by mouth every evening.  30 tablet  11   No current facility-administered medications for this visit.   Family History  Problem Relation Age of Onset  . Diabetes Mother   . COPD Mother   . Heart disease Mother     CHF  . Diabetes Father   . Diabetes Sister   . Heart disease Sister     died of enlarged heart  . Endometriosis Sister    History   Social History  . Marital Status: Divorced    Spouse Name: N/A    Number of Children: 1  . Years of Education: 10th grade   Occupational History  . part-time     caregiver for elderly gentleman   Social History Main Topics  . Smoking status: Current Some Day Smoker -- 1.00 packs/day for 25 years    Types: Cigarettes  . Smokeless tobacco: Never Used     Comment: Can do herself   1/2PPD  . Alcohol Use: Yes     Comment: occasional - 1x per month.  . Drug  Use: No  . Sexual Activity: Not Currently    Birth Control/ Protection: Surgical   Other Topics Concern  . Not on file   Social History Narrative   Lives in [place], [place] with partner.   Review of Systems: CONSTITUTIONAL- No Fever, weightloss, night sweat or change in appetite. SKIN- No Rash, colour changes or itching. HEAD- No Headache or dizziness. RESPIRATORY- No Cough or SOB. CARDIAC- No Palpitations, DOE, PND or chest pain. GI- No nausea, vomiting, diarrhoea, constipation, abd pain. URINARY- No Frequency, urgency, straining or dysuria. NEUROLOGIC- No Numbness, syncope, seizures or burning. [facility]- Denies depression or anxiety.  Objective:  Physical Exam: Filed Vitals:   03/03/14 1429  BP: 156/92  Pulse: 72  Temp: 98 F (36.7 C)  TempSrc: Oral  Height: 5' (1.524 m)  Weight: 148 lb 11.2 oz (67.45 kg)  SpO2: 99%   GENERAL- alert, co-operative, appears as stated age, not in any distress. HEENT- Atraumatic, normocephalic, PERRL, EOMI,  no cervical LN enlargement, thyroid does not appear enlarged, neck supple. CARDIAC- RRR, 3/6 systolic murmur, aortic area, no  rubs  or gallops. RESP- Moving equal volumes of air, and clear to auscultation bilaterally, no wheezes or crackles. ABDOMEN- Soft, nontender, no guarding or rebound, no palpable masses or organomegaly, bowel sounds present. NEURO- No obvious Cr N abnormality, strenght upper and lower extremities, Gait- Normal. EXTREMITIES- pulse 2+, symmetric, no pedal edema. SKIN- Warm, dry, No rash or lesion. PSYCH- Normal mood and affect, appropriate thought content and speech.  Assessment & Plan:  The patient's case and plan of care was discussed with attending physician, Dr. Heide SparkNarendra.  Please see problem based charting for assessment and plan.

## 2014-03-03 NOTE — Assessment & Plan Note (Signed)
On simvastatin- 40mg  daily. Risk factors- HTN and smoking. Will continue for now, if cost is an issue, consider Lovastatin- as it is on the 4 dollar list.

## 2014-03-03 NOTE — Assessment & Plan Note (Addendum)
BP Readings from Last 3 Encounters:  03/03/14 156/92  04/20/13 163/80  04/15/13 135/85    Lab Results  Component Value Date   NA 140 04/15/2013   K 4.1 04/15/2013   CREATININE 0.71 04/15/2013    Assessment: Blood pressure control:  Uncontrolled Progress toward BP goal:    Goal- 140s systolic, 80s disatolic. Comments: Complaint with HCTZ- 25mg  daily, Norvasc- 10mg  daily. BP usually better controlled. Pt say she thinks her Bp is 140s usually at home, and 80s- diastolic.  Plan: Medications:  continue current medications Educational resources provided:   Self management tools provided:   Other plans: Pt to come in 1 month with BP log, adviced to check BP once dasy and write it down.  - Pt was not ready to have lab drawn today, she says she will have it next visit- BMEt next visit. - Pt has no insurance and declined meeting Deb Hill to discuss finances. - Follow up on 2/6 Murmur- aortic area next visit if still present.

## 2014-03-03 NOTE — Assessment & Plan Note (Signed)
Declined flu shot. Says she will try the stool cards. Can not financially afford to have a colonoscopy at this point. But pt says he will consider if FOBT are positive.

## 2014-03-03 NOTE — Progress Notes (Signed)
INTERNAL MEDICINE TEACHING ATTENDING ADDENDUM - Levii Hairfield, MD: I reviewed and discussed at the time of visit with the resident Dr. Emokpae, the patient's medical history, physical examination, diagnosis and results of pertinent tests and treatment and I agree with the patient's care as documented.  

## 2014-03-25 LAB — HEMOCCULT SLIDES (X 3 CARDS)

## 2014-03-31 ENCOUNTER — Encounter: Payer: Self-pay | Admitting: Internal Medicine

## 2014-03-31 ENCOUNTER — Ambulatory Visit (INDEPENDENT_AMBULATORY_CARE_PROVIDER_SITE_OTHER): Payer: Self-pay | Admitting: Internal Medicine

## 2014-03-31 VITALS — BP 138/77 | HR 72 | Temp 97.5°F | Ht 61.0 in | Wt 146.9 lb

## 2014-03-31 DIAGNOSIS — F418 Other specified anxiety disorders: Secondary | ICD-10-CM

## 2014-03-31 DIAGNOSIS — I10 Essential (primary) hypertension: Secondary | ICD-10-CM

## 2014-03-31 LAB — BASIC METABOLIC PANEL
BUN: 10 mg/dL (ref 6–23)
CALCIUM: 10.2 mg/dL (ref 8.4–10.5)
CO2: 30 meq/L (ref 19–32)
Chloride: 97 mEq/L (ref 96–112)
Creat: 0.69 mg/dL (ref 0.50–1.10)
Glucose, Bld: 85 mg/dL (ref 70–99)
POTASSIUM: 3.8 meq/L (ref 3.5–5.3)
SODIUM: 136 meq/L (ref 135–145)

## 2014-03-31 NOTE — Assessment & Plan Note (Addendum)
Pt requesting for a prescription for Clonazepam. Pt tried 2 tablets of clonazepam given to her by her neighbour, and says she felt better, her anxiety resolved. Pt says she is always worried about several things all the time, especially her finances and bills, also having to support a relative who is having health and legal issues . Pt tearful. Has tried Prozac for 8 months, without response and then zoloft for 2 weeks, which made her sleepy, so she stopped taking them. Pt today does not want to try any other medications that would not give her immediate relief.  Pt is a pleasant lady, but really explained to pt why I was not comfortable prescribing it, as i did not feel we had exhausted all options before resorting to benzodiazepine (SNRIs, as option), risk of dependence and tolerance, at this time I didn't think it would be in her best interests and stressed the need to see a psychiatrist since she wasn't willing to try any other meds. Pt says for financial reasons she does not want to see another doctor. Also noted pt took medications- benzos prescribed for friend.  My attending physician came in to talk to pt about this pt agreed to plan. Pt to see Chauncey Readingeb Hill to discuss getting the orange card again, then follow up with psych. Information given to pt [facility] and to call our Clinical social worker given to pt.

## 2014-03-31 NOTE — Assessment & Plan Note (Signed)
BP Readings from Last 3 Encounters:  03/31/14 138/77  03/03/14 156/92  04/20/13 163/80    Lab Results  Component Value Date   NA 140 04/15/2013   K 4.1 04/15/2013   CREATININE 0.71 04/15/2013    Assessment: Blood pressure control:  Controlled Progress toward BP goal:   At goal Comments: Pt brought log to clinic today. Check her Bp over the past month. Blood pressure mostly 120s to 140s. 2 readings of 150s. Complaint with meds- HCTZ- 25mg  daily, Norvasc- 10mg  daily.   Plan: Medications:  continue current medications Educational resources provided:   Self management tools provided:   Other plans: BMP today. - Pt to meet with Chauncey Readingeb Hill to discuss orange card.

## 2014-03-31 NOTE — Patient Instructions (Signed)
General Instructions:     Please [facility] This will give you the opportunity to see a specialist who will better take care of your depression.   We   Please bring your medicines with you each time you come to clinic.  Medicines may include prescription medications, over-the-counter medications, herbal remedies, eye drops, vitamins, or other pills.   Generalized Anxiety Disorder Generalized anxiety disorder (GAD) is a mental disorder. It interferes with life functions, including relationships, work, and school. GAD is different from normal anxiety, which everyone experiences at some point in their lives in response to specific life events and activities. Normal anxiety actually helps us prepare for and get through these life events and activities. Normal anxiety goes away after the event or activity is over.  GAD causes anxiety that is not necessarily related to specific events or activities. It also causes excess anxiety in proportion to specific events or activities. The anxiety associated with GAD is also difficult to control. GAD can vary from mild to severe. People with severe GAD can have intense waves of anxiety with physical symptoms (panic attacks).  SYMPTOMS The anxiety and worry associated with GAD are difficult to control. This anxiety and worry are related to many life events and activities and also occur more days than not for 6 months or longer. People with GAD also have three or more of the following symptoms (one or more in children):  Restlessness.   Fatigue.  Difficulty concentrating.   Irritability.  Muscle tension.  Difficulty sleeping or unsatisfying sleep. DIAGNOSIS GAD is diagnosed through an assessment by your health care provider. Your health care provider will ask you questions aboutyour mood,physical symptoms, and events in your life. Your health care provider may ask you about your medical history and use of alcohol or  drugs, including prescription medicines. Your health care provider may also do a physical exam and blood tests. Certain medical conditions and the use of certain substances can cause symptoms similar to those associated with GAD. Your health care provider may refer you to a mental health specialist for further evaluation. TREATMENT The following therapies are usually used to treat GAD:   Medication. Antidepressant medication usually is prescribed for long-term daily control. Antianxiety medicines may be added in severe cases, especially when panic attacks occur.   Talk therapy (psychotherapy). Certain types of talk therapy can be helpful in treating GAD by providing support, education, and guidance. A form of talk therapy called cognitive behavioral therapy can teach you healthy ways to think about and react to daily life events and activities.  Stress managementtechniques. These include yoga, meditation, and exercise and can be very helpful when they are practiced regularly. A mental health specialist can help determine which treatment is best for you. Some people see improvement with one therapy. However, other people require a combination of therapies.

## 2014-03-31 NOTE — Progress Notes (Signed)
Patient ID: Kelsey Hudson, female   DOB: [birth year], 58 y.o.   MRN: [id number]   Subjective:   Patient ID: Kelsey Hudson female   DOB: [birth year] 58 y.o.   MRN: [id number]  HPI: [name] is a 58 y.o. with PMH listed below. Presented today for follow up visit. Please see problem based charting for assessment and plan.  Past Medical History  Diagnosis Date  . Hypertension   . Depression   . Anxiety   . Insomnia   . Tobacco abuse   . Abnormal mammogram 05/2009    Probable benign calcifications in the right breast - BIRADS 3  . Hyperlipidemia LDL goal < 130 06/23/2008    TG decreased 543 on 11/06 to 269 11/07   Current Outpatient Prescriptions  Medication Sig Dispense Refill  . amLODipine (NORVASC) 10 MG tablet Take 1 tablet (10 mg total) by mouth daily.  30 tablet  11  . aspirin 81 MG tablet Take 1 tablet (81 mg total) by mouth daily.  30 tablet    . calcium-vitamin D (OSCAL WITH D) 500-200 MG-UNIT per tablet Take 1 tablet by mouth daily.  30 tablet  11  . hydrochlorothiazide (HYDRODIURIL) 25 MG tablet Take 1 tablet (25 mg total) by mouth daily.  90 tablet  4  . simvastatin (ZOCOR) 40 MG tablet Take 1 tablet (40 mg total) by mouth every evening.  30 tablet  11   No current facility-administered medications for this visit.   Family History  Problem Relation Age of Onset  . Diabetes Mother   . COPD Mother   . Heart disease Mother     CHF  . Diabetes Father   . Diabetes Sister   . Heart disease Sister     died of enlarged heart  . Endometriosis Sister    History   Social History  . Marital Status: Divorced    Spouse Name: N/A    Number of Children: 1  . Years of Education: 10th grade   Occupational History  . part-time     caregiver for elderly gentleman   Social History Main Topics  . Smoking status: Current Some Day Smoker -- 1.00 packs/day for 25 years    Types: Cigarettes  . Smokeless tobacco: Never Used     Comment: Can do herself   1/2PPD  .  Alcohol Use: Yes     Comment: occasional - 1x per month.  . Drug Use: No  . Sexual Activity: Not Currently    Birth Control/ Protection: Surgical   Other Topics Concern  . None   Social History Narrative   [place] in [place], [place].   Review of Systems: CONSTITUTIONAL- No Fever, weightloss, night sweat or change in appetite. SKIN- No Rash, colour changes or itching. HEAD- No Headache or dizziness. EYES- No Vision loss, pain, redness, double or blurred vision. EARS- No vertigo, hearing loss or ear discharge. Mouth/throat- No Sorethroat, dentures, or bleeding gums. RESPIRATORY- No Cough or SOB. CARDIAC- No Palpitations, DOE, PND or chest pain. GI- No nausea, vomiting, diarrhoea, constipation, abd pain. URINARY- No Frequency, urgency, straining or dysuria. NEUROLOGIC- No Numbness, syncope, seizures or burning. [facility] Denies depression or anxiety.  Objective:  Physical Exam: Filed Vitals:   03/31/14 1431  BP: 138/77  Pulse: 72  Temp: 97.5 F (36.4 C)  TempSrc: Oral  Height: 5\' 1"  (1.549 m)  Weight: 146 lb 14.4 oz (66.633 kg)  SpO2: 98%   GENERAL- alert, co-operative, appears as stated  age, not in any distress. HEENT- Atraumatic, normocephalic, PERRL, EOMI, oral mucosa appears moist, good and intact dentition. No carotid bruit, no cervical LN enlargement, thyroid does not appear enlarged, neck supple. CARDIAC- RRR, 2/6 systolic murmur aortic area,  Lungs- Moving equal vols of air, clear to auscultation bilaterally, no wheezes or crackles. ABDOMEN- Soft, nontender, no guarding or rebound, no palpable masses or organomegaly, bowel sounds present. BACK- Normal curvature of the spine, No tenderness along the vertebrae, no CVA tenderness. NEURO- No obvious Cr N abnormality, strenght upper and lower extremities- 5/5, Gait- Normal. EXTREMITIES- pulse 2+, symmetric, no pedal edema. SKIN- Warm, dry, No rash or lesion. PSYCH- Normal mood and affect, appropriate thought content  and speech.  Assessment & Plan:   The patient's case and plan of care was discussed with attending physician, Dr. Josem KaufmannKlima.   Please see problem based charting for assessment and plan.

## 2014-04-06 ENCOUNTER — Encounter: Payer: Self-pay | Admitting: Internal Medicine

## 2014-04-08 NOTE — Progress Notes (Signed)
I saw and evaluated the patient.  I personally confirmed the key portions of Dr. Fredirick LatheEmokpae's history and exam and reviewed pertinent patient test results.  The assessment, diagnosis, and plan were formulated together and I agree with the documentation in the resident's note.  Ideally would like to try non-addictive alternatives to treat generalized anxiety before jumping to benzos.  Will try to help patient receive [employer] and encouraged mental health follow-up if she is desiring benzos exclusively for her generalized anxiety.

## 2014-04-14 ENCOUNTER — Other Ambulatory Visit: Payer: Self-pay | Admitting: Internal Medicine

## 2014-04-14 MED ORDER — DULOXETINE HCL 30 MG PO CPEP: 30.0000 mg | ORAL_CAPSULE | Freq: Every day | ORAL | Status: DC

## 2014-04-14 MED ORDER — CLONAZEPAM 0.5 MG PO TABS: 0.5000 mg | ORAL_TABLET | Freq: Every day | ORAL | Status: DC

## 2014-04-16 ENCOUNTER — Telehealth: Payer: Self-pay | Admitting: Internal Medicine

## 2014-04-16 NOTE — Telephone Encounter (Signed)
Called patient to talk to her about control of her anxiety disorder and when she will get the orange card, so [facility]. Pt says she has not been able to is so busy with work she can not go. Talked to patient about exploring other options, starting an SNRI - duloxetine which is also a first line agent for GAD, at 30mg  daily for one week and then 60mg  daily and also 20 pills of Clonazepam 0.5mg  as needed only once daily, to give time for the SNRI to take effect. As per Up to Date, this is a well recognized approach, the benzos can be gradually tapered off. Pt so far has not given any problems, none documented in chart so fat, no questionable behaviour, seeking for pain meds, no frequent clinic or hospital visits, complies with my treatment regimen and plan, went to a drug store checked her blood pressure at leadt three times a week for a month wrote them down and brought it in a month for me to see exactly as I  instructed her.  We therefore reached a compromise, pt will start both meds, and gradually we will taper off the clonazepam. Pt made to understand she cannot give her pills to anyone, this is not a medication that will be continued indefinitely. If this fails she will need referral to psych. Pt counselled about the risk of withdrawal and dependence, so risk of seizures if discont abruptly, she is therefore to take it PRN only, not everyday. Pt voiced understanding and agreed to plan. Talked to my attending who saw pt with me last visit, about the change in plan, he felt it was reasonable, and I could go ahead with it.  Plan- reacess in 8 weeks - prescrition for duloxetine and 20 pills clonazepan given to patient, she came to clinic to pick it up.  Kelsey SallesEjiro Tasnia Spegal, MD. IMTS PGY 2  Kelsey SallesEjiro Chane Cowden, MD.

## 2014-04-21 ENCOUNTER — Other Ambulatory Visit: Payer: Self-pay | Admitting: Internal Medicine

## 2015-04-27 ENCOUNTER — Encounter: Payer: Self-pay | Admitting: Student

## 2015-05-20 ENCOUNTER — Other Ambulatory Visit: Payer: Self-pay

## 2015-05-20 NOTE — Telephone Encounter (Signed)
rec'd refill request via fax on HCTZ and amlodipine, request states last written/filled date of 03/05/15 which is not consistent with epic review.  Pt not seen here since 03/03/2014.  Have left message for return call from pharmacy

## 2015-05-20 NOTE — Telephone Encounter (Signed)
Olegario MessierKathy from [facility] requesting the nurse to call back. Please call back @ [phone], press [phone].

## 2015-05-20 NOTE — Telephone Encounter (Signed)
Spoke with pharmacy, no other rx have been rec'd, dates were incorrect on paperwork sent.  Pt last filled 90 on 02/06/15 and has no additional refills.  Will send request.

## 2015-05-23 MED ORDER — AMLODIPINE BESYLATE 10 MG PO TABS: 10.0000 mg | ORAL_TABLET | Freq: Every day | ORAL | Status: DC

## 2015-05-23 MED ORDER — HYDROCHLOROTHIAZIDE 25 MG PO TABS: 25.0000 mg | ORAL_TABLET | Freq: Every day | ORAL | Status: DC

## 2015-05-24 NOTE — Telephone Encounter (Signed)
Phoned in both rx to [facility]

## 2015-05-25 ENCOUNTER — Encounter: Payer: Self-pay | Admitting: Internal Medicine

## 2015-05-25 ENCOUNTER — Ambulatory Visit (INDEPENDENT_AMBULATORY_CARE_PROVIDER_SITE_OTHER): Payer: Self-pay | Admitting: Internal Medicine

## 2015-05-25 VITALS — BP 169/81 | HR 75 | Temp 98.1°F | Ht 61.0 in | Wt 150.2 lb

## 2015-05-25 DIAGNOSIS — F17210 Nicotine dependence, cigarettes, uncomplicated: Secondary | ICD-10-CM

## 2015-05-25 DIAGNOSIS — Z0000 Encounter for general adult medical examination without abnormal findings: Secondary | ICD-10-CM

## 2015-05-25 DIAGNOSIS — I10 Essential (primary) hypertension: Secondary | ICD-10-CM

## 2015-05-25 DIAGNOSIS — F17200 Nicotine dependence, unspecified, uncomplicated: Secondary | ICD-10-CM

## 2015-05-25 DIAGNOSIS — F418 Other specified anxiety disorders: Secondary | ICD-10-CM

## 2015-05-25 MED ORDER — HYDROCHLOROTHIAZIDE 25 MG PO TABS: 25.0000 mg | ORAL_TABLET | Freq: Every day | ORAL | Status: DC

## 2015-05-25 MED ORDER — SIMVASTATIN 40 MG PO TABS: 40.0000 mg | ORAL_TABLET | Freq: Every evening | ORAL | Status: DC

## 2015-05-25 MED ORDER — AMLODIPINE BESYLATE 10 MG PO TABS: 10.0000 mg | ORAL_TABLET | Freq: Every day | ORAL | Status: DC

## 2015-05-25 MED ORDER — LISINOPRIL 10 MG PO TABS: 10.0000 mg | ORAL_TABLET | Freq: Every day | ORAL | Status: DC

## 2015-05-25 NOTE — Patient Instructions (Signed)
We will be starting a new blood pressure medication called Lisinopril- Take one tablet once a day. We will start at a low dose, when you come in 1 month, we will increase the dose and change it to the combination pill- HCTZ+Lisinopril, this will be one pill you will be taking everyday.  It was nice seeing you today.   Please see Chauncey [facility] for [facility] [facility], this will give you discounts on some of your medications and Lab work we do.  Also we will ike to do a colonoscopy and Mammogram. The orange card may help with this.  Try to keep your legs elevated, this will help reduce the risk of the Varicose veins in your legs getting worse. Compression stockings could also help.    Varicose Veins Varicose veins are veins that have become enlarged and twisted. They are usually seen in the legs but can occur in other parts of the body as well. CAUSES This condition is the result of valves in the veins not working properly. Valves in the veins help to return blood from the leg to the heart. If these valves are damaged, blood flows backward and backs up into the veins in the leg near the skin. This causes the veins to become larger. RISK FACTORS People who are on their feet a lot, who are pregnant, or who are overweight are more likely to develop varicose veins. SIGNS AND SYMPTOMS  Bulging, twisted-appearing, bluish veins, most commonly found on the legs.  Leg pain or a feeling of heaviness. These symptoms may be worse at the end of the day.  Leg swelling.  Changes in skin color. DIAGNOSIS A health care provider can usually diagnose varicose veins by examining your legs. Your health care provider may also recommend an ultrasound of your leg veins. TREATMENT Most varicose veins can be treated at home.However, other treatments are available for people who have persistent symptoms or want to improve the cosmetic appearance of the varicose veins. These treatment options  include:  Sclerotherapy. A solution is injected into the vein to close it off.  Laser treatment. A laser is used to heat the vein to close it off.  Radiofrequency vein ablation. An electrical current produced by radio waves is used to close off the vein.  Phlebectomy. The vein is surgically removed through small incisions made over the varicose vein.  Vein ligation and stripping. The vein is surgically removed through incisions made over the varicose vein after the vein has been tied (ligated). HOME CARE INSTRUCTIONS  Do not stand or sit in one position for long periods of time. Do not sit with your legs crossed. Rest with your legs raised during the day.  Wear compression stockings as directed by your health care provider. These stockings help to prevent blood clots and reduce swelling in your legs.  Do not wear other tight, encircling garments around your legs, pelvis, or waist.  Walk as much as possible to increase blood flow.  Raise the foot of your bed at night with 2-inch blocks.  If you get a cut in the skin over the vein and the vein bleeds, lie down with your leg raised and press on it with a clean cloth until the bleeding stops. Then place a bandage (dressing) on the cut. See your health care provider if it continues to bleed. SEEK MEDICAL CARE IF:  The skin around your ankle starts to break down.  You have pain, redness, tenderness, or hard swelling  in your leg over a vein.  You are uncomfortable because of leg pain.

## 2015-05-25 NOTE — Progress Notes (Signed)
Patient ID: Kelsey Hudson, female   DOB: 1956/01/11, 59 y.o.   MRN: [id number]   Subjective:   Patient ID: Kelsey Hudson female   DOB: 28-Apr-1956 59 y.o.   MRN: [id number]  HPI: [name] is a 59 y.o. with PMH listed below, presented today to follow up on her chronic medical conditions- hypertension.  Past Medical History  Diagnosis Date  . Hypertension   . Depression   . Anxiety   . Insomnia   . Tobacco abuse   . Abnormal mammogram 05/2009    Probable benign calcifications in the right breast - BIRADS 3  . Hyperlipidemia LDL goal < 130 06/23/2008    TG decreased 543 on 11/06 to 269 11/07   Current Outpatient Prescriptions  Medication Sig Dispense Refill  . amLODipine (NORVASC) 10 MG tablet Take 1 tablet (10 mg total) by mouth daily. 90 tablet 0  . aspirin 81 MG tablet Take 1 tablet (81 mg total) by mouth daily. 30 tablet   . calcium-vitamin D (OSCAL WITH D) 500-200 MG-UNIT per tablet Take 1 tablet by mouth daily. 30 tablet 11  . clonazePAM (KLONOPIN) 0.5 MG tablet Take 1 tablet (0.5 mg total) by mouth daily. 20 tablet 0  . DULoxetine (CYMBALTA) 30 MG capsule Take 1 capsule (30 mg total) by mouth daily.  once a day for 1 week, then  once a day. 42 capsule 0  . hydrochlorothiazide (HYDRODIURIL) 25 MG tablet Take 1 tablet (25 mg total) by mouth daily. 90 tablet 0  . simvastatin (ZOCOR) 40 MG tablet TAKE ONE TABLET BY MOUTH IN THE EVENING 90 tablet 2   No current facility-administered medications for this visit.   Family History  Problem Relation Age of Onset  . Diabetes Mother   . COPD Mother   . Heart disease Mother     CHF  . Diabetes Father   . Diabetes Sister   . Heart disease Sister     died of enlarged heart  . Endometriosis Sister    Social History   Social History  . Marital Status: Divorced    Spouse Name: N/A  . Number of Children: 1  . Years of Education: 10th grade   Occupational History  . part-time     caregiver for elderly gentleman    Social History Main Topics  . Smoking status: Current Some Day Smoker -- 1.00 packs/day for 25 years    Types: Cigarettes  . Smokeless tobacco: Never Used     Comment: Can do herself   1/2PPD  . Alcohol Use: 0.0 oz/week    0 Standard drinks or equivalent per week     Comment: occasional - 1x per month.  . Drug Use: No  . Sexual Activity: Not Currently    Birth Control/ Protection: Surgical   Other Topics Concern  . None   Social History Narrative   Lives in [place], [place] with partner.   Review of Systems: CONSTITUTIONAL- No Fever, weightloss SKIN- No Rash, colour changes or itching. HEAD- No Headache or dizziness. RESPIRATORY- No Cough or SOB. CARDIAC- No Palpitations, DOE, PND or chest pain. GI- No diarrhoea, or abd pain,. URINARY- No Frequency or dysuria. NEUROLOGIC- No Numbness, syncope, seizures or burning. PYSCH- Say she has some anxiety, but she declines help for this today  Objective:  Physical Exam: Filed Vitals:   05/25/15 1413  BP: 178/88  Pulse: 74  Temp: 98.1 F (36.7 C)  TempSrc: Oral  Height:  (1.549 m)  Weight: 150 lb 3.2 oz (68.13 kg)  SpO2: 98%   GENERAL- alert, co-operative, appears as stated age, not in any distress. HEENT- Atraumatic, normocephalic, PERRL,oral mucosa appears moist. CARDIAC- RRR, 2/6 systolic murmur same intensity as previously examined, right upper sternal border, no rubs or gallops. RESP-clear to auscultation bilaterally, no wheezes or crackles. ABDOMEN- Soft, nontender, bowel sounds present. NEURO- No obvious Cr N abnormality,  Gait- Normal. EXTREMITIES- pulse 2+, symmetric, no pedal edema. SKIN- Warm, dry, No rash or lesion. PSYCH- Normal mood and affect, appropriate thought content and speech.  Assessment & Plan:   The patient's case and plan of care was discussed with attending physician, Dr. Josem KaufmannKlima.  Please see problem based charting for assessment and plan.

## 2015-05-26 ENCOUNTER — Telehealth: Payer: Self-pay | Admitting: Internal Medicine

## 2015-05-26 NOTE — Telephone Encounter (Signed)
Patient seen yesterday and states meds not called in for her Norvasc.  Patient also stated she is using [facility] @ [phone].

## 2015-05-26 NOTE — Assessment & Plan Note (Signed)
Declined flu shot. Due for colonoscopy, 3 stool cards neg for blood last year, she says he cannot afford a colonoscopy, - recommend getting a repeat FOBT X3 next visit. Mammogram also due, she denies family hx of breast or colon cancer.

## 2015-05-26 NOTE — Assessment & Plan Note (Signed)
Still smoking, now 1/2 a pack a day. She is not ready to quit. Declined help. Plan- Cont to encourage cessation and tell her the risks associated with smoking.

## 2015-05-26 NOTE — Progress Notes (Signed)
Case discussed with Dr. Emokpae soon after the resident saw the patient. We reviewed the resident's history and exam and pertinent patient test results. I agree with the assessment, diagnosis, and plan of care documented in the resident's note. 

## 2015-05-26 NOTE — Telephone Encounter (Signed)
Pharmacy has the rx on file, pt was concerned that she did not get a physical prescription.  Confirmed on file with pharmacy, pt is aware.

## 2015-05-26 NOTE — Assessment & Plan Note (Signed)
She says all the meds she has tried have not helped- Including 2 SSRIs. We prescribed Duloxetine, but she says the med cost- > $300 dollars for 14 pills, so she did not pick it up. She says the low dose clonazepam- 0.5mg , which was prescribed as a bridge did not help either. She denies suicidal or homicidal ideations.  Plan- Recommended seeing a psychiatrist, [facility], and getting the orange card. She declined help, says she is okay for now.

## 2015-05-26 NOTE — Assessment & Plan Note (Addendum)
BP Readings from Last 3 Encounters:  05/25/15 169/81  03/31/14 138/77  03/03/14 156/92    Lab Results  Component Value Date   NA 136 03/31/2014   K 3.8 03/31/2014   CREATININE 0.69 03/31/2014    Assessment: Blood pressure control:  Uncontrolled Progress toward BP goal:    Not at goal Comments: Pt says she has been complaint with her medications. She says she has been checking her blood pressure at home, and it is usually high, 150s- 170s.   Plan: Medications: She is on Norvasc and HCTZ now, max dose. Will add lisinopril 10mg . She is very hesitant about adding new medications or getting labs that are not absolutely neccesary. But i explained the importance of getting her bp under control and she understands.  - We agreed that on her next visit, we can increase the dose of the lisinopril to 20mg  and then give her the combination pill with HCTZ- lisinopril (25-20mg  daily), which should not cost more on the $4 [facility] list. - Emphasized that the orange card could help with meds and lab work in the hospitals, to see our financial cousellor. - Bmet next visit, check Cr and K. - CBC next visit

## 2015-06-25 ENCOUNTER — Ambulatory Visit (INDEPENDENT_AMBULATORY_CARE_PROVIDER_SITE_OTHER): Payer: Self-pay | Admitting: Internal Medicine

## 2015-06-25 ENCOUNTER — Encounter: Payer: Self-pay | Admitting: Internal Medicine

## 2015-06-25 VITALS — BP 139/68 | HR 72 | Temp 98.4°F | Ht 61.0 in | Wt 150.2 lb

## 2015-06-25 DIAGNOSIS — F17200 Nicotine dependence, unspecified, uncomplicated: Secondary | ICD-10-CM

## 2015-06-25 DIAGNOSIS — R05 Cough: Secondary | ICD-10-CM

## 2015-06-25 DIAGNOSIS — F17210 Nicotine dependence, cigarettes, uncomplicated: Secondary | ICD-10-CM

## 2015-06-25 DIAGNOSIS — Z79899 Other long term (current) drug therapy: Secondary | ICD-10-CM

## 2015-06-25 DIAGNOSIS — I10 Essential (primary) hypertension: Secondary | ICD-10-CM

## 2015-06-25 MED ORDER — LISINOPRIL 10 MG PO TABS: 10.0000 mg | ORAL_TABLET | Freq: Every day | ORAL | Status: DC

## 2015-06-25 NOTE — Assessment & Plan Note (Addendum)
HPI: Patient has been taking her blood pressure medications as directed.  She overall feels she is tolerating the medications well, I did review common side effects and she has noticed a mild dry cough since starting the lisinopril.   She has been monitoring her blood pressure at home and it has remained <140/90, she forgot to bring the log today.  A: Essential Hypertension, at goal  P: - Continue HCTZ  daily, Amlodipine  daily, and lisinopril  daily. - Check BMP and CBC - I did discuss changing to an ARB to avoid the cough, however she is concerned about cost and does not mind the cough at this time

## 2015-06-25 NOTE — Assessment & Plan Note (Signed)
  Assessment: Progress toward smoking cessation:  smoking the same amount Barriers to progress toward smoking cessation:  none Comments: overall has decreased to 1/2 ppd  Plan: Instruction/counseling given:  I counseled patient on the dangers of tobacco use, advised patient to stop smoking, and reviewed strategies to maximize success. Educational resources provided:    Self management tools provided:    Medications to assist with smoking cessation:  None Patient agreed to the following self-care plans for smoking cessation: cut down the number of cigarettes smoked  Other plans: patient is in pre contemplation stage, spent 2 minutes discussing smoking cessation

## 2015-06-25 NOTE — Patient Instructions (Signed)
General Instructions:  Good work with the blood pressure!  If your cough is bothersome let us know and we can change the medication.   Please bring your medicines with you each time you come to clinic.  Medicines may include prescription medications, over-the-counter medications, herbal remedies, eye drops, vitamins, or other pills.   Progress Toward Treatment Goals:  Treatment Goal 06/25/2015  Blood pressure at goal  Stop smoking smoking the same amount    Self Care Goals & Plans:  Self Care Goal 06/25/2015  Manage my medications take my medicines as prescribed; bring my medications to every visit; refill my medications on time  Monitor my health keep track of my blood pressure  Eat healthy foods eat more vegetables; eat foods that are low in salt; eat baked foods instead of fried foods  Be physically active find an activity I enjoy  Stop smoking cut down the number of cigarettes smoked    No flowsheet data found.   Care Management & Community Referrals:  Referral 06/25/2015  Referrals made for care management support none needed

## 2015-06-25 NOTE — Progress Notes (Signed)
[facility] Subjective:   Patient ID: Kelsey Hudson female   DOB: [birth year] 60 y.o.   MRN: [id number]  HPI: [name] is a 60 y.o. female with a PMH detailed below who presents for 1 month follow up of HTN  Please see problem based A&P below for further details about the status of her chronic medical problems.    Past Medical History  Diagnosis Date  . Hypertension   . Depression   . Anxiety   . Insomnia   . Tobacco abuse   . Abnormal mammogram 05/2009    Probable benign calcifications in the right breast - BIRADS 3  . Hyperlipidemia LDL goal < 130 06/23/2008    TG decreased 543 on 11/06 to 269 11/07   Current Outpatient Prescriptions  Medication Sig Dispense Refill  . amLODipine (NORVASC) 10 MG tablet Take 1 tablet (10 mg total) by mouth daily. 90 tablet 1  . aspirin 81 MG tablet Take 1 tablet (81 mg total) by mouth daily. 30 tablet   . calcium-vitamin D (OSCAL WITH D) 500-200 MG-UNIT per tablet Take 1 tablet by mouth daily. 30 tablet 11  . hydrochlorothiazide (HYDRODIURIL) 25 MG tablet Take 1 tablet (25 mg total) by mouth daily. 90 tablet 1  . lisinopril (PRINIVIL,ZESTRIL) 10 MG tablet Take 1 tablet (10 mg total) by mouth daily. 90 tablet 3  . simvastatin (ZOCOR) 40 MG tablet Take 1 tablet (40 mg total) by mouth every evening. 90 tablet 2   No current facility-administered medications for this visit.   Family History  Problem Relation Age of Onset  . Diabetes Mother   . COPD Mother   . Heart disease Mother     CHF  . Diabetes Father   . Diabetes Sister   . Heart disease Sister     died of enlarged heart  . Endometriosis Sister    Social History   Social History  . Marital Status: Divorced    Spouse Name: N/A  . Number of Children: 1  . Years of Education: 10th grade   Occupational History  . part-time     caregiver for elderly gentleman   Social History Main Topics  . Smoking status: Current Some Day Smoker -- 0.50  packs/day for 25 years    Types: Cigarettes  . Smokeless tobacco: Never Used  . Alcohol Use: 0.0 oz/week    0 Standard drinks or equivalent per week     Comment: occasional - 1x per month.  . Drug Use: No  . Sexual Activity: Not Currently    Birth Control/ Protection: Surgical   Other Topics Concern  . None   Social History Narrative   Lives in [place], [place] with partner.   Review of Systems: Review of Systems  Constitutional: Positive for malaise/fatigue (mild fatigue).  Eyes: Negative for blurred vision.  Respiratory: Positive for cough (mild dry cough since starting lisinopril).   Cardiovascular: Negative for chest pain.  Gastrointestinal: Negative for abdominal pain.  Genitourinary: Negative for frequency.  Musculoskeletal: Negative for myalgias.  Neurological: Negative for headaches.  Endo/Heme/Allergies: Negative for polydipsia.     Objective:  Physical Exam: Filed Vitals:   06/25/15 0942  BP: 139/68  Pulse: 72  Temp: 98.4 F (36.9 C)  TempSrc: Oral  Height:  (1.549 m)  Weight: 150 lb 3.2 oz (68.13 kg)  SpO2: 99%  Physical Exam  Constitutional: She is well-developed, well-nourished, and in no distress.  Eyes: Conjunctivae are normal.  Cardiovascular:  Normal rate and regular rhythm.   Pulmonary/Chest: Effort normal and breath sounds normal.  Abdominal: Soft. Bowel sounds are normal.  Musculoskeletal: She exhibits no edema.  Nursing note and vitals reviewed.   Assessment & Plan:  Case discussed with Dr. Oswaldo Done  TOBACCO ABUSE  Assessment: Progress toward smoking cessation:  smoking the same amount Barriers to progress toward smoking cessation:  none Comments: overall has decreased to 1/2 ppd  Plan: Instruction/counseling given:  I counseled patient on the dangers of tobacco use, advised patient to stop smoking, and reviewed strategies to maximize success. Educational resources provided:    Self management tools provided:    Medications to assist  with smoking cessation:  None Patient agreed to the following self-care plans for smoking cessation: cut down the number of cigarettes smoked  Other plans: patient is in pre contemplation stage, spent 2 minutes discussing smoking cessation    Essential hypertension HPI: Patient has been taking her blood pressure medications as directed.  She overall feels she is tolerating the medications well, I did review common side effects and she has noticed a mild dry cough since starting the lisinopril.   She has been monitoring her blood pressure at home and it has remained <140/90, she forgot to bring the log today.  A: Essential Hypertension, at goal  P: - Continue HCTZ  daily, Amlodipine  daily, and lisinopril  daily. - Check BMP and CBC    Medications Ordered Meds ordered this encounter  Medications  . lisinopril (PRINIVIL,ZESTRIL) 10 MG tablet    Sig: Take 1 tablet (10 mg total) by mouth daily.    Dispense:  90 tablet    Refill:  3   Other Orders Orders Placed This Encounter  Procedures  . CBC no Diff  . BMP8+Anion Gap   Follow Up: Return in about 1 year (around 06/24/2016).

## 2015-06-26 LAB — BMP8+ANION GAP
ANION GAP: 20 mmol/L — AB (ref 10.0–18.0)
BUN / CREAT RATIO: 16 (ref 9–23)
BUN: 12 mg/dL (ref 6–24)
CHLORIDE: 99 mmol/L (ref 96–106)
CO2: 22 mmol/L (ref 18–29)
Calcium: 10.7 mg/dL — ABNORMAL HIGH (ref 8.7–10.2)
Creatinine, Ser: 0.73 mg/dL (ref 0.57–1.00)
GFR calc Af Amer: 106 mL/min/{1.73_m2} (ref >59)
GFR calc non Af Amer: 92 mL/min/{1.73_m2} (ref >59)
GLUCOSE: 108 mg/dL — AB (ref 65–99)
POTASSIUM: 4.7 mmol/L (ref 3.5–5.2)
SODIUM: 141 mmol/L (ref 134–144)

## 2015-06-26 LAB — CBC
Hematocrit: 47.2 % — ABNORMAL HIGH (ref 34.0–46.6)
Hemoglobin: 16.3 g/dL — ABNORMAL HIGH (ref 11.1–15.9)
MCH: 32.2 pg (ref 26.6–33.0)
MCHC: 34.5 g/dL (ref 31.5–35.7)
MCV: 93 fL (ref 79–97)
PLATELETS: 294 10*3/uL (ref 150–379)
RBC: 5.06 x10E6/uL (ref 3.77–5.28)
RDW: 13.8 % (ref 12.3–15.4)
WBC: 6.2 10*3/uL (ref 3.4–10.8)

## 2015-06-28 ENCOUNTER — Encounter: Payer: Self-pay | Admitting: Internal Medicine

## 2015-06-28 NOTE — Progress Notes (Signed)
Internal Medicine Clinic Attending  Case discussed with Dr. Hoffman at the time of the visit.  We reviewed the resident's history and exam and pertinent patient test results.  I agree with the assessment, diagnosis, and plan of care documented in the resident's note.  

## 2015-11-19 ENCOUNTER — Encounter: Payer: Self-pay | Admitting: *Deleted

## 2016-01-04 ENCOUNTER — Other Ambulatory Visit: Payer: Self-pay | Admitting: *Deleted

## 2016-01-04 DIAGNOSIS — I10 Essential (primary) hypertension: Secondary | ICD-10-CM

## 2016-01-04 MED ORDER — HYDROCHLOROTHIAZIDE 25 MG PO TABS: 25.0000 mg | ORAL_TABLET | Freq: Every day | ORAL | 2 refills | Status: DC

## 2016-01-04 MED ORDER — AMLODIPINE BESYLATE 10 MG PO TABS: 10.0000 mg | ORAL_TABLET | Freq: Every day | ORAL | 0 refills | Status: DC

## 2016-01-04 NOTE — Telephone Encounter (Signed)
Please return to clinic this month for blood work and follow-up of your high blood pressure.

## 2016-01-04 NOTE — Telephone Encounter (Signed)
Please make an appointment to come to clinic to reassess hypertension and medications.

## 2016-01-04 NOTE — Telephone Encounter (Signed)
Patient needs an appointment for blood work and hypertension follow-up. I will prescribe her with enough medication for the month. Please have her return to clinic [facility]) for BP management.

## 2016-01-05 NOTE — Telephone Encounter (Signed)
Could you schedule this patient for a follow up this month? Thanks!

## 2016-01-05 NOTE — Telephone Encounter (Signed)
Message sent to front office to schedule pt an appt. 

## 2016-02-10 ENCOUNTER — Encounter: Payer: Self-pay | Admitting: Internal Medicine

## 2016-03-16 ENCOUNTER — Ambulatory Visit (INDEPENDENT_AMBULATORY_CARE_PROVIDER_SITE_OTHER): Payer: Self-pay | Admitting: Internal Medicine

## 2016-03-16 DIAGNOSIS — Z79899 Other long term (current) drug therapy: Secondary | ICD-10-CM

## 2016-03-16 DIAGNOSIS — F17210 Nicotine dependence, cigarettes, uncomplicated: Secondary | ICD-10-CM

## 2016-03-16 DIAGNOSIS — M25561 Pain in right knee: Secondary | ICD-10-CM

## 2016-03-16 DIAGNOSIS — Z0000 Encounter for general adult medical examination without abnormal findings: Secondary | ICD-10-CM

## 2016-03-16 DIAGNOSIS — M1711 Unilateral primary osteoarthritis, right knee: Secondary | ICD-10-CM

## 2016-03-16 DIAGNOSIS — F17200 Nicotine dependence, unspecified, uncomplicated: Secondary | ICD-10-CM

## 2016-03-16 DIAGNOSIS — I10 Essential (primary) hypertension: Secondary | ICD-10-CM

## 2016-03-16 MED ORDER — ASPIRIN 81 MG PO TABS: 81.0000 mg | ORAL_TABLET | Freq: Every day | ORAL | Status: AC

## 2016-03-16 MED ORDER — HYDROCHLOROTHIAZIDE 25 MG PO TABS: 25.0000 mg | ORAL_TABLET | Freq: Every day | ORAL | 2 refills | Status: DC

## 2016-03-16 MED ORDER — AMLODIPINE BESYLATE 10 MG PO TABS: 10.0000 mg | ORAL_TABLET | Freq: Every day | ORAL | 0 refills | Status: DC

## 2016-03-16 MED ORDER — SIMVASTATIN 40 MG PO TABS: 40.0000 mg | ORAL_TABLET | Freq: Every evening | ORAL | 2 refills | Status: DC

## 2016-03-16 MED ORDER — LISINOPRIL 10 MG PO TABS: 10.0000 mg | ORAL_TABLET | Freq: Every day | ORAL | 3 refills | Status: DC

## 2016-03-16 MED ORDER — CALCIUM CARBONATE-VITAMIN D 500-200 MG-UNIT PO TABS: 1.0000 | ORAL_TABLET | Freq: Every day | ORAL | 11 refills | Status: AC

## 2016-03-16 NOTE — Assessment & Plan Note (Signed)
Patient continues to smoke. She is not amenable to quitting at this time.

## 2016-03-16 NOTE — Assessment & Plan Note (Signed)
Her blood pressure is well controlled. I will not make changes to her blood pressure medications at this time. I have refilled them and sent them electronically to her pharmacy. -- Amlodipine 10 mg once daily -- HCTZ 25 mg once daily -- Lisinopril 10 mg once daily

## 2016-03-16 NOTE — Assessment & Plan Note (Signed)
Patient complains of an 8 month history of right knee pain. She states the right knee pain is sharp and stabbing. It is most painful when she first wakes up in the morning. It is also worsened with movement. There are no factors that alleviate this pain. This pain seems to be constant with some fluctuation in her symptoms. On physical examination of her knee there is no erythema, cellulitis, fluid or evidence of infection. Range of motion and strength are normal. Sensation is intact. No evidence of ligamentous or meniscal injury. No red flag symptoms on her examination. There is mild crepitus to passive and active extension of the right knee. At this time I think the most likely explanation for the patient's right knee pain is osteoarthritis. We'll treat her for osteoarthritis and I do not think imaging is warranted at this time. -- Acetaminophen 3000 mg daily for 2 weeks -- If pain worsens she will be advised to return to the clinic

## 2016-03-16 NOTE — Patient Instructions (Signed)
It was a pleasure seeing you today. Thank you for choosing [facility] for your healthcare needs.   Please take Tylenol (acetaminophen) 3000 mg every day for 2 weeks. I would take this medication in the following schedule. 1000 mg with breakfast, 1000 mg with lunch and 1000 mg with dinner. Make sure you take this every day and if you do not have any pain relief after 2 weeks please call the clinic and make another appointment. Also, make sure you do not take cold medications that have Tylenol in them. I do not when she did take more than 3000 mg a day.   Osteoarthritis Osteoarthritis is a disease that causes soreness and inflammation of a joint. It occurs when the cartilage at the affected joint wears down. Cartilage acts as a cushion, covering the ends of bones where they meet to form a joint. Osteoarthritis is the most common form of arthritis. It often occurs in older people. The joints affected most often by this condition include those in the:  Ends of the fingers.  Thumbs.  Neck.  Lower back.  Knees.  Hips. CAUSES  Over time, the cartilage that covers the ends of bones begins to wear away. This causes bone to rub on bone, producing pain and stiffness in the affected joints.  RISK FACTORS Certain factors can increase your chances of having osteoarthritis, including:  Older age.  Excessive body weight.  Overuse of joints.  Previous joint injury. SIGNS AND SYMPTOMS   Pain, swelling, and stiffness in the joint.  Over time, the joint may lose its normal shape.  Small deposits of bone (osteophytes) may grow on the edges of the joint.  Bits of bone or cartilage can break off and float inside the joint space. This may cause more pain and damage. DIAGNOSIS  Your health care provider will do a physical exam and ask about your symptoms. Various tests may be ordered, such as:  X-rays of the affected joint.  Blood tests to rule out other types of arthritis. Additional tests may  be used to diagnose your condition. TREATMENT  Goals of treatment are to control pain and improve joint function. Treatment plans may include:  A prescribed exercise program that allows for rest and joint relief.  A weight control plan.  Pain relief techniques, such as:  Properly applied heat and cold.  Electric pulses delivered to nerve endings under the skin (transcutaneous electrical nerve stimulation [TENS]).  Massage.  Certain nutritional supplements.  Medicines to control pain, such as:  Acetaminophen.  Nonsteroidal anti-inflammatory drugs (NSAIDs), such as naproxen.  Narcotic or central-acting agents, such as tramadol.  Corticosteroids. These can be given orally or as an injection.  Surgery to reposition the bones and relieve pain (osteotomy) or to remove loose pieces of bone and cartilage. Joint replacement may be needed in advanced states of osteoarthritis. HOME CARE INSTRUCTIONS   Take medicines only as directed by your health care provider.  Maintain a healthy weight. Follow your health care provider's instructions for weight control. This may include dietary instructions.  Exercise as directed. Your health care provider can recommend specific types of exercise. These may include:  Strengthening exercises. These are done to strengthen the muscles that support joints affected by arthritis. They can be performed with weights or with exercise bands to add resistance.  Aerobic activities. These are exercises, such as brisk walking or low-impact aerobics, that get your heart pumping.  Range-of-motion activities. These keep your joints limber.  Balance and agility exercises.  These help you maintain daily living skills.  Rest your affected joints as directed by your health care provider.  Keep all follow-up visits as directed by your health care provider. SEEK MEDICAL CARE IF:   Your skin turns red.  You develop a rash in addition to your joint pain.  You  have worsening joint pain.  You have a fever along with joint or muscle aches. SEEK IMMEDIATE MEDICAL CARE IF:  You have a significant loss of weight or appetite.  You have night sweats. FOR MORE INFORMATION   National Institute of Arthritis and Musculoskeletal and Skin Diseases: www.niams.http://www.myers.net/nih.gov  [employer] Aging: https://walker.com/www.nia.nih.gov  American College of Rheumatology: www.rheumatology.org   This information is not intended to replace advice given to you by your health care provider. Make sure you discuss any questions you have with your health care provider.   Document Released: 05/22/2005 Document Revised: 06/12/2014 Document Reviewed: 01/27/2013 Elsevier Interactive Patient Education [employer]2016 Elsevier Inc.

## 2016-03-16 NOTE — Assessment & Plan Note (Signed)
Patient denied wanting a flu shot today.

## 2016-03-16 NOTE — Progress Notes (Signed)
   CC: Right knee pain HPI: Kelsey Hudson is a 60 y.o. female with a h/o of hypertension, hyperlipidemia, depression and tobacco abuse who presents with a month history of right knee pain. She denies headache, chest pains or shortness of breath. She denies nausea, vomiting or abdominal pain. She denies constipation or diarrhea. She denies fevers, night sweats or weight loss.    Review of Systems: A complete ROS was negative except as per HPI.  Physical Exam: Vitals:   03/16/16 1502  BP: 116/71  Pulse: 78  Temp: 98.4 F (36.9 C)  TempSrc: Oral  SpO2: 98%  Weight: 161 lb 8 oz (73.3 kg)  Height: 5\' 1"  (1.549 m)   General appearance: alert and cooperative Head: Normocephalic, without obvious abnormality, atraumatic Lungs: clear to auscultation bilaterally Heart: Regular rate and rhythm, grade 2/6 holosystolic murmur heard best at the right upper sternal border Abdomen: soft, non-tender; bowel sounds normal; no masses,  no organomegaly Right knee exam without evidence of erythema, fluid or infection. Crepitus with passive and active range of motion. Pain over palpation of the medial aspect of the right knee. Normal range of motion, sensation and muscle strength. No evidence of ligamentous or meniscal injury.  Assessment & Plan:  See encounters tab for problem based medical decision making. Patient seen with Dr. Cleda DaubE. Hoffman  Signed: Thomasene LotJames Ulanda Tackett, MD 03/16/2016, 4:03 PM  Pager: [phone]

## 2016-03-17 LAB — BMP8+ANION GAP
ANION GAP: 19 mmol/L — AB (ref 10.0–18.0)
BUN / CREAT RATIO: 22 (ref 9–23)
BUN: 22 mg/dL (ref 6–24)
CHLORIDE: 97 mmol/L (ref 96–106)
CO2: 23 mmol/L (ref 18–29)
Calcium: 10.5 mg/dL — ABNORMAL HIGH (ref 8.7–10.2)
Creatinine, Ser: 1.02 mg/dL — ABNORMAL HIGH (ref 0.57–1.00)
GFR calc Af Amer: 70 mL/min/{1.73_m2} (ref >59)
GFR calc non Af Amer: 61 mL/min/{1.73_m2} (ref >59)
GLUCOSE: 95 mg/dL (ref 65–99)
POTASSIUM: 4.6 mmol/L (ref 3.5–5.2)
SODIUM: 139 mmol/L (ref 134–144)

## 2016-03-21 NOTE — Progress Notes (Signed)
Internal Medicine Clinic Attending  I saw and evaluated the patient.  I personally confirmed the key portions of the history and exam documented by Dr. Taylor and I reviewed pertinent patient test results.  The assessment, diagnosis, and plan were formulated together and I agree with the documentation in the resident's note.  

## 2016-04-12 ENCOUNTER — Encounter: Payer: Self-pay | Admitting: Internal Medicine

## 2016-04-12 ENCOUNTER — Ambulatory Visit (INDEPENDENT_AMBULATORY_CARE_PROVIDER_SITE_OTHER): Payer: Self-pay | Admitting: Internal Medicine

## 2016-04-12 DIAGNOSIS — R197 Diarrhea, unspecified: Secondary | ICD-10-CM

## 2016-04-12 DIAGNOSIS — R000 Tachycardia, unspecified: Secondary | ICD-10-CM

## 2016-04-12 LAB — COMPREHENSIVE METABOLIC PANEL
ALBUMIN: 4.5 g/dL (ref 3.5–5.0)
ALT: 42 U/L (ref 14–54)
AST: 37 U/L (ref 15–41)
Alkaline Phosphatase: 89 U/L (ref 38–126)
Anion gap: 11 (ref 5–15)
BUN: 13 mg/dL (ref 6–20)
CHLORIDE: 102 mmol/L (ref 101–111)
CO2: 24 mmol/L (ref 22–32)
Calcium: 10.3 mg/dL (ref 8.9–10.3)
Creatinine, Ser: 0.75 mg/dL (ref 0.44–1.00)
GFR calc Af Amer: >60 mL/min (ref >60)
GFR calc non Af Amer: >60 mL/min (ref >60)
GLUCOSE: 126 mg/dL — AB (ref 65–99)
POTASSIUM: 3.6 mmol/L (ref 3.5–5.1)
SODIUM: 137 mmol/L (ref 135–145)
Total Bilirubin: 0.4 mg/dL (ref 0.3–1.2)
Total Protein: 7.7 g/dL (ref 6.5–8.1)

## 2016-04-12 NOTE — Patient Instructions (Signed)
Your labwork looks good. Please keep up Pepto Bismol and try keeping the Gatorade down.  If things get worse, please see us back, but we expect it should get better in the next 1-2 weeks.

## 2016-04-12 NOTE — Progress Notes (Signed)
   CC: diarrhea, vomiting  HPI:  [name] is a 60 y.o. female who presents today for diarrhea, vomiting. Please see assessment & plan for status of chronic medical problems.   Past Medical History:  Diagnosis Date  . Abnormal mammogram 05/2009   Probable benign calcifications in the right breast - BIRADS 3  . Anxiety   . Depression   . Hyperlipidemia LDL goal < 130 06/23/2008   TG decreased 543 on 11/06 to 269 11/07  . Hypertension   . Insomnia   . Tobacco abuse     Review of Systems:  Please see each problem below for a pertinent review of systems.   Physical Exam:  Vitals:   04/12/16 0955  BP: 125/74  Pulse: 93  Temp: 97.8 F (36.6 C)  TempSrc: Oral  SpO2: 99%  Weight: 155 lb 3.2 oz (70.4 kg)   Physical Exam  Constitutional: She is oriented to person, place, and time. No distress.  HENT:  Head: Normocephalic and atraumatic.  Mouth/Throat: Oropharynx is clear and moist.  Eyes: Conjunctivae are normal. No scleral icterus.  Cardiovascular: Normal rate.   Systolic ejection murmur best appreciated over the right upper sternal border  Pulmonary/Chest: Effort normal. No respiratory distress.  Abdominal: Soft. Bowel sounds are normal. She exhibits no distension and no mass. There is no tenderness. There is no rebound and no guarding.  Neurological: She is alert and oriented to person, place, and time.  Skin: Skin is warm and dry. She is not diaphoretic.     Assessment & Plan:   See Encounters Tab for problem based charting.  Patient discussed with Dr. Oswaldo DoneVincent

## 2016-04-12 NOTE — Assessment & Plan Note (Signed)
Assessment 2 days after her last office visit here, she noted acute onset of diarrhea and vomiting. She has been having difficulty keeping food down as well as liquids. Though she is unable to quantify the amount of diarrhea she is having daily, she describes as mostly liquidy and nonbloody. She has been taking Pepto-Bismol with some improvement in her symptoms though they return or after. She denies any fever, lightheadedness, dizziness, recent antibiotic exposure, recent hospitalizations, recent travel, change in food, exposure to poultry though does recall being around a coworker was sick with similar symptoms which warranted hospitalization. This morning, she was able to keep an egg sandwich down but is concerned that her symptoms have not improved over 2 weeks.  I suspect she had an acute diarrheal illness, possibly from an infection or toxin though it is mild at best given that she did not have any other constitutional symptoms.Aside from mild tachycardia, her physical exam is reassuring for no dehydration. No acute abdomen is also reassuring. She is on an ACE inhibitor and thiazide diuretic which per at risk for acute kidney injury, so I'm inclined to check electrolytes.  Plan -Check stat CMET -Reassured the patient that her symptoms will improve -Recommend hydration with Gatorade and continue Pepto Bismol as needed -Recommend she hold HCTZ and lisinopril until she is tolerating oral intake  ADDENDUM 04/12/2016  11:54 AM:  CMET reassuring for no LFT abnormalities or deterioration in renal function.

## 2016-04-13 NOTE — Progress Notes (Signed)
Internal Medicine Clinic Attending  Case discussed with Dr. Patel,Rushil at the time of the visit.  We reviewed the resident's history and exam and pertinent patient test results.  I agree with the assessment, diagnosis, and plan of care documented in the resident's note.  

## 2016-07-06 ENCOUNTER — Other Ambulatory Visit: Payer: Self-pay | Admitting: *Deleted

## 2016-07-06 DIAGNOSIS — I10 Essential (primary) hypertension: Secondary | ICD-10-CM

## 2016-07-06 MED ORDER — AMLODIPINE BESYLATE 10 MG PO TABS: 10.0000 mg | ORAL_TABLET | Freq: Every day | ORAL | 0 refills | Status: DC

## 2016-08-16 ENCOUNTER — Other Ambulatory Visit: Payer: Self-pay | Admitting: *Deleted

## 2016-08-16 DIAGNOSIS — I10 Essential (primary) hypertension: Secondary | ICD-10-CM

## 2016-08-16 MED ORDER — HYDROCHLOROTHIAZIDE 25 MG PO TABS: 25.0000 mg | ORAL_TABLET | Freq: Every day | ORAL | 0 refills | Status: DC

## 2016-10-19 ENCOUNTER — Encounter: Payer: Self-pay | Admitting: Internal Medicine

## 2016-10-19 ENCOUNTER — Ambulatory Visit (INDEPENDENT_AMBULATORY_CARE_PROVIDER_SITE_OTHER): Payer: Self-pay | Admitting: Internal Medicine

## 2016-10-19 VITALS — BP 141/71 | HR 72 | Temp 98.1°F | Wt 154.0 lb

## 2016-10-19 DIAGNOSIS — I10 Essential (primary) hypertension: Secondary | ICD-10-CM

## 2016-10-19 DIAGNOSIS — Z598 Other problems related to housing and economic circumstances: Secondary | ICD-10-CM

## 2016-10-19 DIAGNOSIS — F17210 Nicotine dependence, cigarettes, uncomplicated: Secondary | ICD-10-CM

## 2016-10-19 DIAGNOSIS — Z597 Insufficient social insurance and welfare support: Secondary | ICD-10-CM

## 2016-10-19 DIAGNOSIS — M1711 Unilateral primary osteoarthritis, right knee: Secondary | ICD-10-CM

## 2016-10-19 DIAGNOSIS — Z79899 Other long term (current) drug therapy: Secondary | ICD-10-CM

## 2016-10-19 DIAGNOSIS — F418 Other specified anxiety disorders: Secondary | ICD-10-CM

## 2016-10-19 DIAGNOSIS — Z0000 Encounter for general adult medical examination without abnormal findings: Secondary | ICD-10-CM

## 2016-10-19 DIAGNOSIS — Z599 Problem related to housing and economic circumstances, unspecified: Secondary | ICD-10-CM

## 2016-10-19 MED ORDER — IBUPROFEN 800 MG PO TABS: 800.0000 mg | ORAL_TABLET | Freq: Three times a day (TID) | ORAL | 0 refills | Status: DC

## 2016-10-19 MED ORDER — AMLODIPINE BESYLATE 10 MG PO TABS: 10.0000 mg | ORAL_TABLET | Freq: Every day | ORAL | 0 refills | Status: DC

## 2016-10-19 MED ORDER — HYDROCHLOROTHIAZIDE 25 MG PO TABS: 25.0000 mg | ORAL_TABLET | Freq: Every day | ORAL | 0 refills | Status: DC

## 2016-10-19 MED ORDER — LISINOPRIL 10 MG PO TABS: 10.0000 mg | ORAL_TABLET | Freq: Every day | ORAL | 3 refills | Status: DC

## 2016-10-19 NOTE — Assessment & Plan Note (Addendum)
Patient behind on multiple screening examinations. She has no insurance making obtaining screenings difficult. We will refer her and provide information [facility]. We'll provide her with information for calling and seeing if she qualifies. If so she'll be able to receive free breast and cervical cancer screening and if in the unfortunate case of malignancy is identified she will receive free treatment (as I understand). I will also discuss with the patient about fit testing for colon cancer. If she is amenable this will be provided. -- Referral for mammogram -- Fit testing  ADDENDUM 11/07/2016 Colon cancer screening by Hemoccult negative. Continue annual screening.

## 2016-10-19 NOTE — Assessment & Plan Note (Addendum)
Depression ongoing but stable. [facility]iatry services at [facility]. Currently on Mirtazapine.  -- Continue therapy

## 2016-10-19 NOTE — Assessment & Plan Note (Addendum)
Vitals:   10/19/16 1504 10/19/16 1606  BP: (!) 145/75 (!) 141/71  Pulse: 77 72  Temp: 98.1 F (36.7 C)    Patient's blood pressure still slightly elevated as documented above. She is currently on amlodipine 10 mg once daily, hydrochlorothiazide 25 mg daily and lisinopril 10 mg daily. She is tolerating these medications well without notable side effects. We will continue with current therapy.She has had increased intake of salt in her diet over the previous month. We will discuss factors to decrease salt in her diet to see if her blood pressure will reach goal. At this time, given that the patient is not following a diet conducive to lowering her blood pressure and she is almost at goal I will not start another antihypertensive medication. -- Amlodipine 10 mg once daily -- Lisinopril 10 mg once daily -- Hydrochlorothiazide 25 mg daily -- Low salt diet

## 2016-10-19 NOTE — Progress Notes (Signed)
   CC: Right knee pain HPI: Ms. Kelsey Hudson is a 61 y.o. female with a past medical history as listed below who presents with right knee pain and for management of chronic medical issues.  Past Medical History:  Diagnosis Date  . Abnormal mammogram 05/2009   Probable benign calcifications in the right breast - BIRADS 3  . Anxiety   . Depression   . Hyperlipidemia LDL goal < 130 06/23/2008   TG decreased 543 on 11/06 to 269 11/07  . Hypertension   . Insomnia   . Tobacco abuse      Review of Systems: Denies chest pain and SOB. Denies polyuria and dysuria. Denies night sweats and sustained weight loss.   Physical Exam: Vitals:   10/19/16 1504  BP: (!) 145/75  Pulse: 77  Temp: 98.1 F (36.7 C)  TempSrc: Oral  SpO2: 98%  Weight: 154 lb (69.9 kg)   BP (!) 145/75 (Patient Position: Sitting, Cuff Size: Normal)   Pulse 77   Temp 98.1 F (36.7 C) (Oral)   Wt 154 lb (69.9 kg)   SpO2 98%   BMI 29.10 kg/m  Physical Exam  Constitutional: She is oriented to person, place, and time. She appears well-developed and well-nourished.  HENT:  Head: Normocephalic.  Cardiovascular: Normal rate and regular rhythm.  Exam reveals no friction rub.   Grade 2/6 also systolic murmur  GI: Soft. Bowel sounds are normal. She exhibits no distension. There is no tenderness.  Musculoskeletal: She exhibits no edema.  Neurological: She is alert and oriented to person, place, and time.     Assessment & Plan:  See encounters tab for problem based medical decision making. Patient discussed with Dr. Criselda PeachesMullen  Signed: Thomasene Lotaylor, Ambrose Wile, MD 10/19/2016, 3:39 PM  Pager: [phone]

## 2016-10-19 NOTE — Assessment & Plan Note (Signed)
The patient continues to have financial difficulties making obtaining screening and medications difficult. We'll continue to work with her and try to provide discounted her free services as they are available.

## 2016-10-19 NOTE — Patient Instructions (Addendum)
It was a pleasure seeing you today. Thank you for choosing [facility] your [facility] needs.   Please continue to take your medicine for your blood pressure.  Please work on a low salt diet.  For your right knee pain I have prescribed ibuprofen 800 mg. Please take 1 pill 3 times daily for a week. Please do not take this medicine for more than one week.  Please ensure you follow-up to gauge her mammogram and Pap smear.  Please return to clinic in 3 months or sooner as needed.  NETI pot  Sinus Rinse What is a sinus rinse? A sinus rinse is a home treatment. It rinses your sinuses with a mixture of salt and water (saline solution). Sinuses are air-filled spaces in your skull behind the bones of your face and forehead. They open into your nasal cavity. To do a sinus rinse, you will need:  Saline solution.  Neti pot or spray bottle. This releases the saline solution into your nose and through your sinuses. You can buy neti pots and spray bottles at:  Your local pharmacy.  A health food store.  Online. When should I do a sinus rinse? A sinus rinse can help to clear your nasal cavity. It can clear:  Mucus.  Dirt.  Dust.  Pollen. You may do a sinus rinse when you have:  A cold.  A virus.  Allergies.  A sinus infection.  A stuffy nose. If you are considering a sinus rinse:  Ask your child's doctor before doing a sinus rinse on your child.  Do not do a sinus rinse if you have had:  Ear or nasal surgery.  An ear infection.  Blocked ears. How do I do a sinus rinse?  Wash your hands.  Disinfect your device using the directions that came with the device.  Dry your device.  Use the solution that comes with your device or one that is sold separately in stores. Follow the mixing directions on the package.  Fill your device with the amount of saline solution as stated in the device instructions.  Stand over a sink and tilt your head sideways over the  sink.  Place the spout of the device in your upper nostril (the one closer to the ceiling).  Gently pour or squeeze the saline solution into the nasal cavity. The liquid should drain to the lower nostril if you are not too congested.  Gently blow your nose. Blowing too hard may cause ear pain.  Repeat in the other nostril.  Clean and rinse your device with clean water.  Air-dry your device. Are there risks of a sinus rinse? Sinus rinse is normally very safe and helpful. However, there are a few risks, which include:  A burning feeling in the sinuses. This may happen if you do not make the saline solution as instructed. Make sure to follow all directions when making the saline solution.  Infection from unclean water. This is rare, but possible.  Nasal irritation. This information is not intended to replace advice given to you by your health care provider. Make sure you discuss any questions you have with your health care provider. Document Released: 12/17/2013 Document Revised: 04/18/2016 Document Reviewed: 10/07/2013 Elsevier Interactive Patient Education  2017 [employer]

## 2016-10-19 NOTE — Addendum Note (Signed)
Addended by: Kathleen ArgueAYLOR, Lyndel Sarate M on: 10/19/2016 04:24 PM   Modules accepted: Orders

## 2016-10-19 NOTE — Assessment & Plan Note (Addendum)
Patient continues to have intermittent severe right knee pain. Prior history and physical examinations are consistent with right knee osteoarthritis. Pain is worse after long days of activity. Sometimes she has benefits with acetaminophen. She has had imaging in the past and was told this joint looked normal. I do not see a role for imaging at this time. She is complaining of pain today. No evidence of infection. No red flag symptoms. I think this is a flare for osteoarthritis. I will prescribe ibuprofen 800 mg 3 times a day 5 days. Also, she says about one month ago she had severe pain in this joint and noticed that it was swollen and felt like fluid was within it. She did not seek medical attention at that time. Since then the fluid has resolved and the pain has improved. I discussed with the patient that if this happens again in the future to schedule an appointment to be seen in the acute care clinic as we would be able to remove the fluid and do analysis to look for potential crystalline arthropathy. She endorsed understanding of this plan. -- Ibuprofen 800 mg 3 times a day 5 days -- Encourage stretching exercises

## 2016-10-20 NOTE — Progress Notes (Signed)
Internal Medicine Clinic Attending  Case discussed with Dr. Taylor at the time of the visit.  We reviewed the resident's history and exam and pertinent patient test results.  I agree with the assessment, diagnosis, and plan of care documented in the resident's note. 

## 2016-10-20 NOTE — Addendum Note (Signed)
Addended by: Debe CoderMULLEN, EMILY B on: 10/20/2016 11:50 AM   Modules accepted: Level of Service

## 2016-10-26 ENCOUNTER — Other Ambulatory Visit: Payer: Self-pay | Admitting: Internal Medicine

## 2016-11-06 ENCOUNTER — Other Ambulatory Visit (INDEPENDENT_AMBULATORY_CARE_PROVIDER_SITE_OTHER): Payer: Self-pay

## 2016-11-06 DIAGNOSIS — Z1211 Encounter for screening for malignant neoplasm of colon: Secondary | ICD-10-CM

## 2016-11-06 DIAGNOSIS — Z0000 Encounter for general adult medical examination without abnormal findings: Secondary | ICD-10-CM

## 2016-11-06 LAB — POC HEMOCCULT BLD/STL (HOME/3-CARD/SCREEN)
Card #2 Fecal Occult Blod, POC: NEGATIVE
FECAL OCCULT BLD: NEGATIVE
Fecal Occult Blood, POC: NEGATIVE

## 2016-11-13 ENCOUNTER — Encounter: Payer: Self-pay | Admitting: *Deleted

## 2016-11-23 ENCOUNTER — Ambulatory Visit (HOSPITAL_COMMUNITY): Payer: Self-pay

## 2016-12-04 ENCOUNTER — Other Ambulatory Visit: Payer: Self-pay | Admitting: Obstetrics and Gynecology

## 2016-12-04 DIAGNOSIS — Z1231 Encounter for screening mammogram for malignant neoplasm of breast: Secondary | ICD-10-CM

## 2016-12-07 ENCOUNTER — Encounter (HOSPITAL_COMMUNITY): Payer: Self-pay

## 2016-12-07 ENCOUNTER — Ambulatory Visit
Admission: RE | Admit: 2016-12-07 | Discharge: 2016-12-07 | Disposition: A | Discharge status: Home / Self Care | Payer: Self-pay | Source: Ambulatory Visit | Attending: Obstetrics and Gynecology | Admitting: Obstetrics and Gynecology

## 2016-12-07 ENCOUNTER — Ambulatory Visit (HOSPITAL_COMMUNITY)
Admission: RE | Admit: 2016-12-07 | Discharge: 2016-12-07 | Disposition: A | Discharge status: Home / Self Care | Payer: Self-pay | Source: Ambulatory Visit | Attending: Obstetrics and Gynecology | Admitting: Obstetrics and Gynecology

## 2016-12-07 VITALS — BP 132/90 | Ht 62.0 in | Wt 157.6 lb

## 2016-12-07 DIAGNOSIS — Z01419 Encounter for gynecological examination (general) (routine) without abnormal findings: Secondary | ICD-10-CM

## 2016-12-07 DIAGNOSIS — Z1231 Encounter for screening mammogram for malignant neoplasm of breast: Secondary | ICD-10-CM

## 2016-12-07 NOTE — Addendum Note (Signed)
Encounter addended by: Lynnell DikeHolland, Gayathri Futrell H, LPN on: 1/6/10967/10/2016 10:51 AM<BR>    Actions taken: Order list changed

## 2016-12-07 NOTE — Patient Instructions (Signed)
Explained breast self awareness with Garnet SierrasBecky J Holderness. Let patient know [facility] will cover Pap smears and HPV typing  every 5 years unless has a history of abnormal Pap smears. [facility]. Appointment scheduled for Thursday, December 07, 2016 at 1050. Let patient know will follow up with her within the next couple weeks with results of Pap smear by letter or phone. Informed patient that the Breast Center will follow up with her within the next couple of weeks with results of mammogram by letter or phone. Garnet SierrasBecky J Anding verbalized understanding.  Giavanni Zeitlin, Kathaleen Maserhristine Poll, RN 10:25 AM

## 2016-12-07 NOTE — Progress Notes (Signed)
No complaints today.   Pap Smear: Pap smear completed today. [facility] and normal. Per patient has no history of an abnormal Pap smear. 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 Last Pap smear result is in EPIC.  Physical exam: Breasts Breasts symmetrical. No skin abnormalities bilateral breasts. No nipple retraction bilateral breasts. No nipple discharge bilateral breasts. No lymphadenopathy. No lumps palpated bilateral breasts. No complaints of pain or tenderness on exam. [facility] for [facility] screening mammogram. Appointment scheduled for Thursday, December 07, 2016 at 1050.  Pelvic/Bimanual   Ext Genitalia No lesions, no swelling and no discharge observed on external genitalia.         Vagina Vagina pink and normal texture. No lesions or discharge observed in vagina.          Cervix Cervix is present. Cervix pink and of normal texture. No discharge observed on cervix.    Uterus Uterus is absent due to a history of a supracervical hysterectomy.    Adnexae Bilateral ovaries are absent due to a history of a supracervical hysterectomy.    Rectovaginal No rectal exam completed today since patient had no rectal complaints. No skin abnormalities observed on exam.  Smoking History: Patient has never smoked.  Patient Navigation: Patient education provided. Access to services provided for patient through [facility] program.   Colorectal Cancer Screening: Per patient has never had a colonoscopy completed. No complaints today. Patient stated she completed the FOBT one month ago.

## 2016-12-08 ENCOUNTER — Other Ambulatory Visit (HOSPITAL_COMMUNITY): Payer: Self-pay | Admitting: *Deleted

## 2016-12-08 ENCOUNTER — Encounter (HOSPITAL_COMMUNITY): Payer: Self-pay | Admitting: *Deleted

## 2016-12-08 ENCOUNTER — Telehealth (HOSPITAL_COMMUNITY): Payer: Self-pay | Admitting: *Deleted

## 2016-12-08 DIAGNOSIS — N760 Acute vaginitis: Principal | ICD-10-CM

## 2016-12-08 DIAGNOSIS — B9689 Other specified bacterial agents as the cause of diseases classified elsewhere: Secondary | ICD-10-CM

## 2016-12-08 LAB — CYTOLOGY - PAP
ADEQUACY: ABSENT
Diagnosis: NEGATIVE
HPV: NOT DETECTED

## 2016-12-08 MED ORDER — METRONIDAZOLE 500 MG PO TABS: 500.0000 mg | ORAL_TABLET | Freq: Two times a day (BID) | ORAL | 0 refills | Status: DC

## 2016-12-08 NOTE — Telephone Encounter (Signed)
Telephoned patient at home number and left message to return call to [facility] 

## 2016-12-08 NOTE — Telephone Encounter (Signed)
[facility] Advised patient of negative pap smear results. HPV was negative. Advise patient pap smear did show bacterial vaginosis and medication was called into pharmacy. Advised patient to finish all medication and no alcohol while taking medication. Next pap smear due in five years. Patient voiced understanding.

## 2017-02-02 ENCOUNTER — Other Ambulatory Visit: Payer: Self-pay | Admitting: *Deleted

## 2017-02-02 DIAGNOSIS — I10 Essential (primary) hypertension: Secondary | ICD-10-CM

## 2017-02-02 MED ORDER — AMLODIPINE BESYLATE 10 MG PO TABS: 10.0000 mg | ORAL_TABLET | Freq: Every day | ORAL | 0 refills | Status: DC

## 2017-02-14 NOTE — Progress Notes (Signed)
   CC: Hypertension Management and Ostearthritis   HPI:  Kelsey Hudson is a 61 y.o. female presenting today for Hypertension management and Osteoarthritis. Please see the assessment and plan for the status of the patient's chronic medical problems.   Past Medical History:  Diagnosis Date  . Abnormal mammogram 05/2009   Probable benign calcifications in the right breast - BIRADS 3  . Anxiety   . Depression   . Hyperlipidemia LDL goal < 130 06/23/2008   TG decreased 543 on 11/06 to 269 11/07  . Hypertension   . Insomnia   . Tobacco abuse    Review of Systems:  Review of systems performed and negative unless otherwise indicated.  Physical Exam:  Vitals:   02/15/17 1336  BP: 132/64  Pulse: 64  Temp: 98 F (36.7 C)  TempSrc: Oral  SpO2: 99%  Weight: 167 lb 12.8 oz (76.1 kg)   Physical Exam  Constitutional: She is oriented to person, place, and time. She appears well-developed and well-nourished.  HENT:  Head: Normocephalic and atraumatic.  Eyes: EOM are normal. No scleral icterus.  Cardiovascular: Normal rate and regular rhythm.   Systolic murmur  Pulmonary/Chest: Effort normal and breath sounds normal. No respiratory distress.  Abdominal: Soft. Bowel sounds are normal. She exhibits no distension.  Musculoskeletal: Normal range of motion. She exhibits no edema, tenderness or deformity.  Neurological: She is alert and oriented to person, place, and time.  Skin: Skin is warm and dry.  Psychiatric: She has a normal mood and affect.     Assessment & Plan:   See Encounters Tab for problem based charting.  Patient seen with Dr. Criselda PeachesMullen

## 2017-02-15 ENCOUNTER — Encounter: Payer: Self-pay | Admitting: Internal Medicine

## 2017-02-15 ENCOUNTER — Ambulatory Visit (INDEPENDENT_AMBULATORY_CARE_PROVIDER_SITE_OTHER): Payer: BLUE CROSS/BLUE SHIELD | Admitting: Internal Medicine

## 2017-02-15 VITALS — BP 132/64 | HR 64 | Temp 98.0°F | Ht 61.0 in | Wt 167.8 lb

## 2017-02-15 DIAGNOSIS — Z79899 Other long term (current) drug therapy: Secondary | ICD-10-CM

## 2017-02-15 DIAGNOSIS — F17200 Nicotine dependence, unspecified, uncomplicated: Secondary | ICD-10-CM

## 2017-02-15 DIAGNOSIS — R011 Cardiac murmur, unspecified: Secondary | ICD-10-CM | POA: Diagnosis not present

## 2017-02-15 DIAGNOSIS — M25551 Pain in right hip: Secondary | ICD-10-CM | POA: Diagnosis not present

## 2017-02-15 DIAGNOSIS — M1711 Unilateral primary osteoarthritis, right knee: Secondary | ICD-10-CM

## 2017-02-15 DIAGNOSIS — I10 Essential (primary) hypertension: Secondary | ICD-10-CM

## 2017-02-15 DIAGNOSIS — F17211 Nicotine dependence, cigarettes, in remission: Secondary | ICD-10-CM | POA: Diagnosis not present

## 2017-02-15 MED ORDER — AMLODIPINE BESYLATE 10 MG PO TABS: 10.0000 mg | ORAL_TABLET | Freq: Every day | ORAL | 0 refills | Status: DC

## 2017-02-15 MED ORDER — HYDROCHLOROTHIAZIDE 25 MG PO TABS: 25.0000 mg | ORAL_TABLET | Freq: Every day | ORAL | 0 refills | Status: DC

## 2017-02-15 MED ORDER — SIMVASTATIN 40 MG PO TABS: 40.0000 mg | ORAL_TABLET | Freq: Every evening | ORAL | 2 refills | Status: DC

## 2017-02-15 NOTE — Patient Instructions (Addendum)
Thank you for allowing us to care for you  For your knee pain: - Please take Aleve on days where you will be more active. - Try a brace or ace bandage to provide support when working. - We will provide a referral for physical therapy.  Your blood pressure was good today! Continue on your current medications and try a low salt diet.  You have been order refills of your medications  Please Return to clinic in 3 months

## 2017-02-15 NOTE — Assessment & Plan Note (Signed)
Patient states she quit smoking in the last several months. She states she is vaping some, but has not smoked in that time. Patient was congratulated on her progress. She was also informed that the risks of vaping are still unknown at this time, and it was recommended that she try to cut down on or quit vaping when she can.

## 2017-02-15 NOTE — Assessment & Plan Note (Addendum)
Patient is currently on Amlodipine , Lisinopril , and HCTZ . Her BP at her previous visit was 141/71 and she was encouraged to implement a low salt diet as she had recently been consuming a lot of salty foods. Her BP today is 132/64. She has been taking her medications as prescribed, but has been unable to implement a low salt diet so far. - Continue current regimen below: - Amlodipine  (refilled) - Lisinopril  - HCTZ  (refilled) - Continue to try Low salt diet

## 2017-02-15 NOTE — Assessment & Plan Note (Addendum)
Patient has a history of Osteoarthritis. She had R knee xray on 09/2015 showing mild medial joint space narrowing. At her previous visit she was experiencing increased pain and was given Ibuprofen 800mg  TID x 5d. This flair resolved with prescribed treatment.  She is not in pain today. She continues to have pain with activity that increases thoughout the day especially when she is on her feet for more than a few hours. She also experiences some associated R hip pain after limping on the knee. She is currently only occassionally taking Ibuprofen 400mg  when she has bad pain and it does not significantly help. She was encouraged to implement some stretching exercises and will be given a referral to PT (and will attend if she can afford it).  - PT referral - Aleve on days when she is expecting to be on her feet for longer periods - Trial of a knee brace or ace bandage, ensuring proper fit

## 2017-02-28 NOTE — Progress Notes (Signed)
Internal Medicine Clinic Attending  I saw and evaluated the patient.  I personally confirmed the key portions of the history and exam documented by Dr. Melvin and I reviewed pertinent patient test results.  The assessment, diagnosis, and plan were formulated together and I agree with the documentation in the resident's note.  

## 2017-05-10 ENCOUNTER — Other Ambulatory Visit: Payer: Self-pay | Admitting: *Deleted

## 2017-05-10 DIAGNOSIS — I10 Essential (primary) hypertension: Secondary | ICD-10-CM

## 2017-05-11 MED ORDER — HYDROCHLOROTHIAZIDE 25 MG PO TABS
25.0000 mg | ORAL_TABLET | Freq: Every day | ORAL | 0 refills | Status: DC
Start: 2017-05-11 — End: 2017-07-12

## 2017-05-11 NOTE — Telephone Encounter (Signed)
Rx Refill Approved

## 2017-06-07 ENCOUNTER — Encounter: Payer: BLUE CROSS/BLUE SHIELD | Admitting: Internal Medicine

## 2017-07-11 NOTE — Progress Notes (Signed)
   CC: Hypertension, Osteoarthritis, Tobacco use  HPI:  [name] is a 62 y.o. F with PMHx listed below presenting for Hypertension, Osteoarthritis, Tobacco use. Please see the A&P for the status of the patient's chronic medical problems.  Past Medical History:  Diagnosis Date  . Abnormal mammogram 05/2009   Probable benign calcifications in the right breast - BIRADS 3  . Anxiety   . Depression   . Hyperlipidemia LDL goal < 130 06/23/2008   TG decreased 543 on 11/06 to 269 11/07  . Hypertension   . Insomnia   . Tobacco abuse    Review of Systems:  Performed and all others negative.  Physical Exam:  Vitals:   07/12/17 1459  BP: (!) 147/63  Pulse: 82  Temp: 98.1 F (36.7 C)  TempSrc: Oral  SpO2: 97%  Weight: 172 lb 14.4 oz (78.4 kg)   Physical Exam  Constitutional: She appears well-developed and well-nourished.  HENT:  Head: Normocephalic and atraumatic.  Eyes: EOM are normal. Right eye exhibits no discharge. Left eye exhibits no discharge.  Cardiovascular: Normal rate and intact distal pulses.  Pulmonary/Chest: Effort normal. No respiratory distress.  Musculoskeletal: Normal range of motion. She exhibits no edema or deformity.  Skin: Skin is warm and dry.    Assessment & Plan:   See Encounters Tab for problem based charting.  Patient discussed with Dr. Cleda DaubE. Hoffman

## 2017-07-11 NOTE — Assessment & Plan Note (Addendum)
History of HTN. BP last Visit 132/64. BP today elevated at 147/63. She has been attempting to implement low salt diet with minimal success thus far. She has been taking her medications as prescribed. Elevated BP today may be due in part to increasing doses on NSAID's for OA pain or the pain itself. She was informed that we will recheck her BP at follow up visit and if it remains elevated we will consider adjusting her medications. - Continue Amlodipine 10mg  Daily - Continue Lisinopril 10mg  Daily - Continue HCTZ 25mg  Daily - Continue to try Low salt diet

## 2017-07-12 ENCOUNTER — Ambulatory Visit: Payer: BLUE CROSS/BLUE SHIELD | Admitting: Internal Medicine

## 2017-07-12 ENCOUNTER — Other Ambulatory Visit: Payer: Self-pay

## 2017-07-12 ENCOUNTER — Other Ambulatory Visit: Payer: Self-pay | Admitting: Internal Medicine

## 2017-07-12 ENCOUNTER — Encounter: Payer: Self-pay | Admitting: Internal Medicine

## 2017-07-12 VITALS — BP 145/64 | HR 66 | Temp 98.1°F | Wt 172.9 lb

## 2017-07-12 DIAGNOSIS — E785 Hyperlipidemia, unspecified: Secondary | ICD-10-CM

## 2017-07-12 DIAGNOSIS — F17210 Nicotine dependence, cigarettes, uncomplicated: Secondary | ICD-10-CM | POA: Diagnosis not present

## 2017-07-12 DIAGNOSIS — M1711 Unilateral primary osteoarthritis, right knee: Secondary | ICD-10-CM | POA: Diagnosis not present

## 2017-07-12 DIAGNOSIS — Z720 Tobacco use: Secondary | ICD-10-CM

## 2017-07-12 DIAGNOSIS — I10 Essential (primary) hypertension: Secondary | ICD-10-CM

## 2017-07-12 MED ORDER — AMLODIPINE BESYLATE 10 MG PO TABS: 10.0000 mg | ORAL_TABLET | Freq: Every day | ORAL | 2 refills | Status: DC

## 2017-07-12 MED ORDER — HYDROCHLOROTHIAZIDE 25 MG PO TABS: 25.0000 mg | ORAL_TABLET | Freq: Every day | ORAL | 2 refills | Status: DC

## 2017-07-12 NOTE — Assessment & Plan Note (Signed)
Patient states that she had a relapse and smoked some cigarettes since her last visit. She states that she threw the rest of the pack out and has not smoked since. She was encouraged that having relapses when quitting tobacco is the norm and she should not be discouraged. She continues to vape and has been unable to cut back significantly. - Advised further reduction in vape use

## 2017-07-12 NOTE — Patient Instructions (Addendum)
Thank you for allowing us to care for you.  For your high blood pressure: - Your BP was up today at 147/63 - Continue to take Lisinopril, Amlodipine, and Hydrochlorothiazide - We will reck you BP at your follow up visit to see if you need changes to your medications - Continue to work on your low salt diet  For your knee pain: - You have been provided with a referral to physical therapy - Continue to use knee braces - Continue to take aleve as needed for busy days - Please let us know what gel you used that help your pain  For your tobacco use: - Congratulations for quitting again - Continue to work on cutting back vaping as your are able  Please follow up in about 3 months

## 2017-07-12 NOTE — Assessment & Plan Note (Addendum)
Patient has a history of Osteoarthritis. At previous visit she was having pain with activity, which worsened during the day. The symptoms have persistent and she has begun to experience pain in her left knee as well. She continues to  experience some associated R hip pain after limping on the knee. Her pain is controlled today,  She is currently taking Aleve before work, which helps her make it through the day. She states that she has tried a gel on her knees that she got from a friend that has relieved her pain for the past couple of days. It sounds like it may be Voltaren gel, but the patient cannot remember the name and thinks it was an all natural product. She was encouraged to implement some stretching exercises and will be given a referral to PT in the past, but did not go to PT as she felt she would try going to the gym first. She felt her gym exercises had not help her pain and states she will be willing to attend 1 or 2 PT session to learn appropriate exercises and therapies. She states she has been using her knee brace on one knee but has not purchased another for the other knee yet. - Referral to physical therapy - Continue to take aleve as needed for days when you will be on your feet for hours - Continue to use your knee brace and buy a second brace for the other knee - Pt is to investigate the name or the gel, will see if this is something we can help her obtain if appropriate

## 2017-07-14 NOTE — Progress Notes (Signed)
Internal Medicine Clinic Attending  Case discussed with Dr. Melvin  at the time of the visit.  We reviewed the resident's history and exam and pertinent patient test results.  I agree with the assessment, diagnosis, and plan of care documented in the resident's note.  

## 2017-07-26 ENCOUNTER — Encounter: Payer: Self-pay | Admitting: Physical Therapy

## 2017-07-26 ENCOUNTER — Other Ambulatory Visit: Payer: Self-pay

## 2017-07-26 ENCOUNTER — Ambulatory Visit: Payer: BLUE CROSS/BLUE SHIELD | Attending: Internal Medicine | Admitting: Physical Therapy

## 2017-07-26 DIAGNOSIS — M25562 Pain in left knee: Secondary | ICD-10-CM | POA: Diagnosis present

## 2017-07-26 DIAGNOSIS — R262 Difficulty in walking, not elsewhere classified: Secondary | ICD-10-CM | POA: Diagnosis present

## 2017-07-26 DIAGNOSIS — M25561 Pain in right knee: Secondary | ICD-10-CM | POA: Insufficient documentation

## 2017-07-26 DIAGNOSIS — G8929 Other chronic pain: Secondary | ICD-10-CM | POA: Diagnosis present

## 2017-07-26 NOTE — Therapy (Signed)
[facility] Outpatient Rehabilitation [facility] [address] [place], [place], [address] Phone: [phone]   Fax:  [phone]  Physical Therapy Evaluation  Patient Details  Name: Kelsey Hudson MRN: [id number] Date of Birth: 31-Jul-1955 Referring Provider: Dr [name]    Encounter Date: 07/26/2017  PT End of Session - 07/26/17 1159    Visit Number  1    Number of Visits  16    Date for PT Re-Evaluation  09/20/17    Authorization Type  BCBS     PT Start Time  1147    PT Stop Time  1231    PT Time Calculation (min)  44 min    Activity Tolerance  Patient tolerated treatment well    Behavior During Therapy  [facility] for tasks assessed/performed       Past Medical History:  Diagnosis Date  . Abnormal mammogram 05/2009   Probable benign calcifications in the right breast - BIRADS 3  . Anxiety   . Carpal tunnel syndrome of left wrist 09/13/2010   Weight loss and proper positioning of the left wrist while sleeping was discussed. Patient is not willing to undergo physical therapy at this point of time secondary to financial constraints. I have asked her to at least wear a wrist splint at night which would help her rest positioning and relieve her pain and tingling.   . Depression   . Hyperlipidemia LDL goal < 130 06/23/2008   TG decreased 543 on 11/06 to 269 11/07  . Hypertension   . Insomnia   . Tobacco abuse     Past Surgical History:  Procedure Laterality Date  . ABDOMINAL HYSTERECTOMY    . subtotal abdominal hysterectomy with bilateral salpingo-oophorectomy  2003   for complex ovarian cysts.    There were no vitals filed for this visit.   Subjective Assessment - 07/26/17 1149    Subjective  Patient has been having right knee pain for several years. Her left knee has began hurting as well. She has been doing some painting which has caused her more pain in the left side.     Pertinent History  depression;     How long can you sit comfortably?  sitting  stiffnes her knee     How long can you stand comfortably?  < 10 min     How long can you walk comfortably?  limited community distances     Diagnostic tests  x-ray 2-3 years ago per patient showed no arthritis     Currently in Pain?  Yes    Pain Score  4  can approach a 10/10     Pain Location  Knee    Pain Orientation  Right    Pain Onset  More than a month ago    Pain Frequency  Constant    Aggravating Factors   standing and walking     Pain Relieving Factors  a save and ibuprofin     Effect of Pain on Daily Activities  difficulty standing for longer periods of time.    Multiple Pain Sites  Yes    Pain Score  0 Pain can reach a 4/10     Pain Location  Knee    Pain Descriptors / Indicators  Aching    Pain Type  Chronic pain    Pain Onset  More than a month ago    Pain Frequency  Constant    Aggravating Factors   standing, walking     Pain Relieving  Factors  rest; cream     Effect of Pain on Daily Activities  difficulty standing for any period of time          [facility] PT Assessment - 07/26/17 0001      Assessment   Medical Diagnosis  Bilateral knee pain R > L     Referring Provider  Dr [name]     Hand Dominance  Right    Prior Therapy  None       Precautions   Precautions  None      Restrictions   Weight Bearing Restrictions  No      Balance Screen   Has the patient fallen in the past 6 months  No    Has the patient had a decrease in activity level because of a fear of falling?   No    Is the patient reluctant to leave their home because of a fear of falling?   No      Prior Function   Level of Independence  Independent    Vocation  Part time employment      Cognition   Overall Cognitive Status  Within Functional Limits for tasks assessed    Attention  Focused    Focused Attention  Appears intact    Memory  Appears intact    Awareness  Appears intact    Problem Solving  Appears intact      Observation/Other Assessments   Observations  bilateral knee  verus in standing; no teminal knee extension on the right in standing.     Focus on Therapeutic Outcomes (FOTO)   70% limitation       Sensation   Light Touch  Appears Intact    Additional Comments  denies parathesias       Coordination   Gross Motor Movements are Fluid and Coordinated  Yes    Fine Motor Movements are Fluid and Coordinated  Yes      Posture/Postural Control   Posture/Postural Control  No significant limitations      ROM / Strength   AROM / PROM / Strength  PROM;AROM;Strength      AROM   Overall AROM Comments  crepitus  noted with active knee extension     AROM Assessment Site  Knee    Right/Left Knee  Right;Left    Right Knee Extension  6    Right Knee Flexion  120    Left Knee Extension  0    Left Knee Flexion  125      Strength   Strength Assessment Site  Hip;Knee    Right/Left Hip  Right;Left    Right Hip Flexion  4/5    Right Hip ABduction  4+/5    Right Hip ADduction  5/5    Left Hip Flexion  4+/5    Left Hip ABduction  5/5    Left Hip ADduction  5/5    Right/Left Knee  Right;Left    Right Knee Flexion  5/5    Right Knee Extension  5/5    Left Knee Flexion  5/5    Left Knee Extension  5/5      Palpation   Patella mobility  patella sits lateral with a lateral tilt bilateral R > L     Palpation comment  No tenderness to palpation of the knee cap       Special Tests   Other special tests  patellar grind test: (+) on the right  Transfers   Comments  notable pop in right knee after transfer from sit to stand       Ambulation/Gait   Gait Comments  Bilateral knee verus; crepitus when she bgan ambualtion; decreased bilateral hip flexion; increased bilateral abduction              Objective measurements completed on examination: See above findings.      [facility] Adult PT Treatment/Exercise - 07/26/17 0001      Knee/Hip Exercises: Stretches   Passive Hamstring Stretch Limitations  setaed 2x20 sec holds; also shown how to do in supine     Other Knee/Hip Stretches  medial patella stretch 5x15 second holds       Knee/Hip Exercises: Seated   Other Seated Knee/Hip Exercises  knee extensin stretch on [place] 5x 10 second hold also shown how to do exercise with ice       Knee/Hip Exercises: Supine   Quad Sets Limitations  quad sets x10 5 sec holds              PT Education - 07/26/17 1350    Education provided  Yes    Education Details  anatomy of condition; HEP; symptom mangement     Person(s) Educated  Patient    Methods  Explanation;Tactile cues;Verbal cues;Demonstration    Comprehension  Verbalized understanding;Returned demonstration;Verbal cues required;Tactile cues required       PT Short Term Goals - 07/26/17 1234      PT SHORT TERM GOAL #1   Title  Patient will report less crepitus with intial standing     Time  4    Period  Weeks    Status  New    Target Date  08/23/17      PT SHORT TERM GOAL #2   Title  Patient will report 3/10 pain at worst with standing     Time  4    Period  Weeks    Status  New    Target Date  08/23/17      PT SHORT TERM GOAL #3   Title  Patient will demosntrate full passive knee extension     Time  4    Period  Weeks    Status  New    Target Date  08/23/17        PT Long Term Goals - 07/26/17 1236      PT LONG TERM GOAL #1   Title  Patient will stand for 1/2 hour without increased pain     Time  8    Period  Weeks    Status  New    Target Date  09/20/17      PT LONG TERM GOAL #2   Title  Patient will return to the gym without pain     Time  8    Period  Weeks    Status  New    Target Date  09/20/17      PT LONG TERM GOAL #3   Title  Patient will demsotrate a 49% limitation on FOTO     Time  8    Period  Weeks    Status  New    Target Date  09/20/17             Plan - 07/26/17 1313    Clinical Impression Statement  Patient is a 62 year old female who presents with pain in bilateral knees right > left. Her right knee has been hurting for  severla  years. her left has just started after climbing a ladder. She has lateral patellar tracking bilateral . She has an extension limitation on the right as well. She has increased pain with standing. Sings and symptoms are consistent with patlelar mal-tracking. She would benefit from skilled therapy to improve her patellar motion, increase strength, and increase extension, in order to improve ability to stand and walk. She would like to go back to a gym.   History and Personal Factors relevant to plan of care:  depression     Clinical Presentation  Evolving    Clinical Presentation due to:  pain is increasing on theleft side     Clinical Decision Making  Low    Rehab Potential  Good    PT Frequency  2x / week    PT Duration  8 weeks    PT Treatment/Interventions  Cryotherapy;Data processing manager;Therapeutic activities;Therapeutic exercise;Patient/family education;Manual techniques;Passive range of motion;Taping    PT Next Visit Plan  continue with patellar mobs; patient dosent tolerate head well; add SLR, SAQ, heel slide; maybe a bridge; Nu-step; extension stretching if needed; review HEP     PT Home Exercise Plan  quad set; pateallar medial glide     Consulted and Agree with Plan of Care  Patient       Patient will benefit from skilled therapeutic intervention in order to improve the following deficits and impairments:  Abnormal gait, Pain, Decreased activity tolerance, Difficulty walking, Decreased endurance, Decreased strength, Decreased range of motion  Visit Diagnosis: Chronic pain of right knee  Chronic pain of left knee  Difficulty in walking, not elsewhere classified     Problem List Patient Active Problem List   Diagnosis Date Noted  . Osteoarthritis of right knee 03/16/2016  . Financial difficulties 11/02/2011  . Preventative health care 11/02/2011  . Abnormal mammogram 05/05/2009  . Hyperlipidemia LDL goal < 130 06/23/2008  .  Tobacco use 05/07/2006  . Depression with anxiety 05/07/2006  . Essential hypertension 05/07/2006    Dessie Coma  PT DPT  07/26/2017, 3:02 PM  [facility] [address] [place], [place], [address] Phone: [phone]   Fax:  [phone]  Name: Kelsey Hudson MRN: [id number] Date of Birth: Jul 12, 1955

## 2017-08-10 ENCOUNTER — Ambulatory Visit: Payer: BLUE CROSS/BLUE SHIELD | Admitting: Physical Therapy

## 2017-08-17 ENCOUNTER — Encounter: Payer: Self-pay | Admitting: Physical Therapy

## 2017-08-17 ENCOUNTER — Ambulatory Visit: Payer: BLUE CROSS/BLUE SHIELD | Attending: Internal Medicine | Admitting: Physical Therapy

## 2017-08-17 DIAGNOSIS — G8929 Other chronic pain: Secondary | ICD-10-CM | POA: Insufficient documentation

## 2017-08-17 DIAGNOSIS — M25562 Pain in left knee: Secondary | ICD-10-CM | POA: Insufficient documentation

## 2017-08-17 DIAGNOSIS — M25561 Pain in right knee: Secondary | ICD-10-CM | POA: Diagnosis not present

## 2017-08-17 DIAGNOSIS — R262 Difficulty in walking, not elsewhere classified: Secondary | ICD-10-CM | POA: Insufficient documentation

## 2017-08-17 NOTE — Therapy (Signed)
[facility] [address] [place], [place], [address] Phone: [phone]   Fax:  [phone]  Physical Therapy Treatment  Patient Details  Name: Kelsey Hudson MRN: [id number] Date of Birth: September 14, 1955 Referring Provider: Dr Beola CordAlexander Melvin    Encounter Date: 08/17/2017  PT End of Session - 08/17/17 1129    Visit Number  2    Number of Visits  16    Date for PT Re-Evaluation  09/20/17    Authorization Type  BCBS     PT Start Time  1100    PT Stop Time  1140    PT Time Calculation (min)  40 min    Activity Tolerance  Patient tolerated treatment well    Behavior During Therapy  [facility] for tasks assessed/performed       Past Medical History:  Diagnosis Date  . Abnormal mammogram 05/2009   Probable benign calcifications in the right breast - BIRADS 3  . Anxiety   . Carpal tunnel syndrome of left wrist 09/13/2010   Weight loss and proper positioning of the left wrist while sleeping was discussed. Patient is not willing to undergo physical therapy at this point of time secondary to financial constraints. I have asked her to at least wear a wrist splint at night which would help her rest positioning and relieve her pain and tingling.   . Depression   . Hyperlipidemia LDL goal < 130 06/23/2008   TG decreased 543 on 11/06 to 269 11/07  . Hypertension   . Insomnia   . Tobacco abuse     Past Surgical History:  Procedure Laterality Date  . ABDOMINAL HYSTERECTOMY    . subtotal abdominal hysterectomy with bilateral salpingo-oophorectomy  2003   for complex ovarian cysts.    There were no vitals filed for this visit.  Subjective Assessment - 08/17/17 1059    Subjective  Patient is having poain today. She has had pain for the past 2 days. She cleaned her house over the past few days and had more pain. She has more pain in her right knee.     Pertinent History  depression;     How long can you sit comfortably?  sitting stiffnes her  knee     How long can you stand comfortably?  < 10 min     How long can you walk comfortably?  limited community distances     Diagnostic tests  x-ray 2-3 years ago per patient showed no arthritis     Currently in Pain?  Yes    Pain Score  8     Pain Location  Knee    Pain Orientation  Right    Pain Descriptors / Indicators  Aching    Pain Type  Chronic pain    Pain Onset  More than a month ago    Pain Frequency  Constant    Aggravating Factors   standing and walking     Effect of Pain on Daily Activities  difficulty standing for long periods of time     Pain Score  2    Pain Location  Knee    Pain Orientation  Left    Pain Descriptors / Indicators  Aching    Pain Type  Chronic pain    Pain Onset  More than a month ago    Pain Frequency  Constant    Aggravating Factors   standing and walking  PT Education - 08/17/17 1128    Education provided  Yes    Education Details  updated HEP; reviewed tehchnique with ther-ex     Person(s) Educated  Patient    Methods  Explanation    Comprehension  Verbalized understanding;Returned demonstration;Verbal cues required;Tactile cues required       PT Short Term Goals - 08/17/17 1209      PT SHORT TERM GOAL #1   Title  Patient will report less crepitus with intial standing     Time  4    Period  Weeks    Status  On-going      PT SHORT TERM GOAL #2   Title  Patient will report 3/10 pain at worst with standing     Time  4    Period  Weeks    Status  On-going      PT SHORT TERM GOAL #3   Title  Patient will demosntrate full passive knee extension     Time  4    Period  Weeks    Status  On-going        PT Long Term Goals - 07/26/17 1236      PT LONG TERM GOAL #1   Title  Patient will stand for 1/2 hour without increased pain     Time  8    Period  Weeks    Status  New    Target Date  09/20/17      PT LONG TERM GOAL #2   Title  Patient will return to the gym without pain      Time  8    Period  Weeks    Status  New    Target Date  09/20/17      PT LONG TERM GOAL #3   Title  Patient will demsotrate a 49% limitation on FOTO     Time  8    Period  Weeks    Status  New    Target Date  09/20/17            Plan - 08/17/17 1208    Clinical Impression Statement  Patient tolerated treatment well. She flet some pulling with her Thomas stretch. She tolerated her other exercises well. Therapy updated her HEP. Therapy talked to her about symptom mangement and improtance of being patient.     Clinical Presentation  Evolving    Clinical Decision Making  Low    Rehab Potential  Good    PT Frequency  2x / week    PT Duration  8 weeks    PT Treatment/Interventions  Cryotherapy;Data processing manager;Therapeutic activities;Therapeutic exercise;Patient/family education;Manual techniques;Passive range of motion;Taping    PT Next Visit Plan  continue with patellar mobs; patient dosent tolerate head well; add SLR, SAQ, heel slide; maybe a bridge; Nu-step; extension stretching if needed; review HEP     PT Home Exercise Plan  quad set; pateallar medial glide     Consulted and Agree with Plan of Care  Patient       Patient will benefit from skilled therapeutic intervention in order to improve the following deficits and impairments:  Abnormal gait, Pain, Decreased activity tolerance, Difficulty walking, Decreased endurance, Decreased strength, Decreased range of motion  Visit Diagnosis: Chronic pain of right knee  Chronic pain of left knee  Difficulty in walking, not elsewhere classified     Problem List Patient Active Problem List   Diagnosis Date Noted  . Osteoarthritis of right knee 03/16/2016  .  Financial difficulties 11/02/2011  . Preventative health care 11/02/2011  . Abnormal mammogram 05/05/2009  . Hyperlipidemia LDL goal < 130 06/23/2008  . Tobacco use 05/07/2006  . Depression with anxiety 05/07/2006  .  Essential hypertension 05/07/2006    Dessie Coma PT DPT  08/17/2017, 12:11 PM  [facility] [address] [place], [place], [address] Phone: [phone]   Fax:  [phone]  Name: Kelsey Hudson MRN: [id number] Date of Birth: 02/16/1956

## 2017-08-24 ENCOUNTER — Ambulatory Visit: Payer: BLUE CROSS/BLUE SHIELD | Admitting: Physical Therapy

## 2017-08-24 ENCOUNTER — Encounter: Payer: Self-pay | Admitting: Physical Therapy

## 2017-08-24 DIAGNOSIS — R262 Difficulty in walking, not elsewhere classified: Secondary | ICD-10-CM

## 2017-08-24 DIAGNOSIS — M25561 Pain in right knee: Principal | ICD-10-CM

## 2017-08-24 DIAGNOSIS — M25562 Pain in left knee: Secondary | ICD-10-CM

## 2017-08-24 DIAGNOSIS — G8929 Other chronic pain: Secondary | ICD-10-CM

## 2017-08-24 NOTE — Therapy (Signed)
[facility] [address] [place], [place], [address] Phone: [phone]   Fax:  [phone]  Physical Therapy Evaluation  Patient Details  Name: Kelsey Hudson MRN: [id number] Date of Birth: 1955-08-04 Referring Provider: Dr [name]    Encounter Date: 08/24/2017  PT End of Session - 08/24/17 1110    Visit Number  3    Number of Visits  16    Date for PT Re-Evaluation  09/20/17    Authorization Type  BCBS     PT Start Time  1105    Activity Tolerance  Patient tolerated treatment well    Behavior During Therapy  [facility] for tasks assessed/performed       Past Medical History:  Diagnosis Date  . Abnormal mammogram 05/2009   Probable benign calcifications in the right breast - BIRADS 3  . Anxiety   . Carpal tunnel syndrome of left wrist 09/13/2010   Weight loss and proper positioning of the left wrist while sleeping was discussed. Patient is not willing to undergo physical therapy at this point of time secondary to financial constraints. I have asked her to at least wear a wrist splint at night which would help her rest positioning and relieve her pain and tingling.   . Depression   . Hyperlipidemia LDL goal < 130 06/23/2008   TG decreased 543 on 11/06 to 269 11/07  . Hypertension   . Insomnia   . Tobacco abuse     Past Surgical History:  Procedure Laterality Date  . ABDOMINAL HYSTERECTOMY    . subtotal abdominal hysterectomy with bilateral salpingo-oophorectomy  2003   for complex ovarian cysts.    There were no vitals filed for this visit.   Subjective Assessment - 08/24/17 1106    Subjective  Patient is having intense pain in her left knee. 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 Pt is also having pain on the right knee but less than the left knee.     Currently in Pain?  Yes    Pain Score  3     Pain Location  Knee    Pain Orientation  Right    Pain  Descriptors / Indicators  Aching    Pain Type  Chronic pain    Pain Onset  More than a month ago    Pain Frequency  Constant    Aggravating Factors   standing and walking     Pain Relieving Factors  ibuprofin     Effect of Pain on Daily Activities  difficulty     Pain Score  7    Pain Location  Knee    Pain Orientation  Left    Pain Descriptors / Indicators  Aching    Pain Type  Chronic pain    Pain Onset  More than a month ago    Pain Frequency  Constant    Aggravating Factors   standing and walking     Pain Relieving Factors  rest; cream     Effect of Pain on Daily Activities  difficulty standing for any periord of time                   No data recorded  Objective measurements completed on examination: See above findings.      [facility] Adult PT Treatment/Exercise - 08/24/17 0001      Knee/Hip Exercises: Stretches   Passive Hamstring Stretch Limitations  supine 3x20sec     Other Knee/Hip Stretches  medial patella stretch 5x15 second holds       Knee/Hip Exercises: Supine   Quad Sets Limitations  quad sets x10 5 sec holds; bilateral    Straight Leg Raises  2 sets;10 reps;Both      Modalities   Modalities  Vasopneumatic      Vasopneumatic   Number Minutes Vasopneumatic   15 minutes    Vasopnuematic Location   Knee    Vasopneumatic Pressure  Low    Vasopneumatic Temperature   32      Manual Therapy   Manual Therapy  Edema management;Passive ROM;Soft tissue mobilization    Edema Management  grade 1-2 fluid movement    Passive ROM  knee flexion and extension              PT Education - 08/24/17 1110    Education provided  Yes    Education Details  symptom management     Person(s) Educated  Patient    Methods  Explanation;Demonstration;Tactile cues;Verbal cues    Comprehension  Verbalized understanding;Returned demonstration       PT Short Term Goals - 08/17/17 1209      PT SHORT TERM GOAL #1   Title  Patient will report less crepitus with  intial standing     Time  4    Period  Weeks    Status  On-going      PT SHORT TERM GOAL #2   Title  Patient will report 3/10 pain at worst with standing     Time  4    Period  Weeks    Status  On-going      PT SHORT TERM GOAL #3   Title  Patient will demosntrate full passive knee extension     Time  4    Period  Weeks    Status  On-going        PT Long Term Goals - 07/26/17 1236      PT LONG TERM GOAL #1   Title  Patient will stand for 1/2 hour without increased pain     Time  8    Period  Weeks    Status  New    Target Date  09/20/17      PT LONG TERM GOAL #2   Title  Patient will return to the gym without pain     Time  8    Period  Weeks    Status  New    Target Date  09/20/17      PT LONG TERM GOAL #3   Title  Patient will demsotrate a 49% limitation on FOTO     Time  8    Period  Weeks    Status  New    Target Date  09/20/17             Plan - 08/24/17 1111    Clinical Impression Statement  Therapy focused on massage to decrease edema in the left knee for pain control. Pt tolerated stretches well. Therapy will progress its focus on strengthening exercises in future visits as patient tolerates.  Therapy added Vasopnematic device to further reduce edema.     Clinical Presentation  Evolving    Clinical Decision Making  Low    Rehab Potential  Good    PT Frequency  2x / week    PT Duration  8 weeks    PT Treatment/Interventions  Cryotherapy;Electrical Stimulation;Ultrasound;Gait training;Stair  training;Therapeutic activities;Therapeutic exercise;Patient/family education;Manual techniques;Passive range of motion;Taping    PT Next Visit Plan  continue with patellar mobs; patient dosent tolerate head well; add SLR, SAQ, heel slide; maybe a bridge; Nu-step; extension stretching if needed; review HEP     PT Home Exercise Plan  quad set; pateallar medial glide     Consulted and Agree with Plan of Care  Patient       Patient will benefit from skilled  therapeutic intervention in order to improve the following deficits and impairments:  Abnormal gait, Pain, Decreased activity tolerance, Difficulty walking, Decreased endurance, Decreased strength, Decreased range of motion  Visit Diagnosis: Chronic pain of right knee  Chronic pain of left knee  Difficulty in walking, not elsewhere classified     Problem List Patient Active Problem List   Diagnosis Date Noted  . Osteoarthritis of right knee 03/16/2016  . Financial difficulties 11/02/2011  . Preventative health care 11/02/2011  . Abnormal mammogram 05/05/2009  . Hyperlipidemia LDL goal < 130 06/23/2008  . Tobacco use 05/07/2006  . Depression with anxiety 05/07/2006  . Essential hypertension 05/07/2006    Dessie Coma PT DPT  08/24/2017, 12:02 PM  [facility] [address] [place], [place], [address] Phone: [phone]   Fax:  [phone]  Name: Kelsey Hudson MRN: [id number] Date of Birth: 05-Jan-1956

## 2017-08-31 ENCOUNTER — Encounter: Payer: Self-pay | Admitting: Physical Therapy

## 2017-08-31 ENCOUNTER — Ambulatory Visit: Payer: BLUE CROSS/BLUE SHIELD | Admitting: Physical Therapy

## 2017-08-31 DIAGNOSIS — G8929 Other chronic pain: Secondary | ICD-10-CM

## 2017-08-31 DIAGNOSIS — M25561 Pain in right knee: Secondary | ICD-10-CM | POA: Diagnosis not present

## 2017-08-31 DIAGNOSIS — R262 Difficulty in walking, not elsewhere classified: Secondary | ICD-10-CM

## 2017-08-31 DIAGNOSIS — M25562 Pain in left knee: Secondary | ICD-10-CM

## 2017-08-31 NOTE — Therapy (Addendum)
[place], [place], [address] Phone: [phone]   Fax:  [phone]  Physical Therapy Treatment/Discharge   Patient Details  Name: Kelsey Hudson MRN: [id number] Date of Birth: [birth year] Referring Provider: Dr Neva Seat    Encounter Date: 08/31/2017  PT End of Session - 08/31/17 1100    Visit Number  4    Number of Visits  16    Date for PT Re-Evaluation  09/20/17    Authorization Type  [employer]     PT Start Time  1055    PT Stop Time  1138    PT Time Calculation (min)  43 min    Activity Tolerance  Patient tolerated treatment well    Behavior During Therapy  [facility] for tasks assessed/performed       Past Medical History:  Diagnosis Date  . Abnormal mammogram 05/2009   Probable benign calcifications in the right breast - BIRADS 3  . Anxiety   . Carpal tunnel syndrome of left wrist 09/13/2010   Weight loss and proper positioning of the left wrist while sleeping was discussed. Patient is not willing to undergo physical therapy at this point of time secondary to financial constraints. I have asked her to at least wear a wrist splint at night which would help her rest positioning and relieve her pain and tingling.   . Depression   . Hyperlipidemia LDL goal < 130 06/23/2008   TG decreased 543 on 11/06 to 269 11/07  . Hypertension   . Insomnia   . Tobacco abuse     Past Surgical History:  Procedure Laterality Date  . ABDOMINAL HYSTERECTOMY    . subtotal abdominal hysterectomy with bilateral salpingo-oophorectomy  2003   for complex ovarian cysts.    There were no vitals filed for this visit.  Subjective Assessment - 08/31/17 1101    Subjective  Patient is having intense pain in her left knee. 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 Pt is also having pain on the right knee but less than the left knee.     Pertinent History  depression;     How  long can you sit comfortably?  sitting stiffnes her knee     How long can you stand comfortably?  < 10 min     How long can you walk comfortably?  limited community distances     Diagnostic tests  x-ray 2-3 years ago per patient showed no arthritis     Currently in Pain?  Yes    Pain Score  4     Pain Orientation  Right;Left    Pain Descriptors / Indicators  Aching    Pain Onset  More than a month ago    Pain Frequency  Constant    Aggravating Factors   standing and walking     Pain Relieving Factors  ibuprofin     Effect of Pain on Daily Activities  difficulty standing and walking.                 No data recorded       [facility] Adult PT Treatment/Exercise - 08/31/17 0001      Knee/Hip Exercises: Stretches   Passive Hamstring Stretch Limitations  supine 3x20sec     Other Knee/Hip Stretches  medial patella stretch 5x15 second holds       Knee/Hip Exercises: Standing   Other Standing Knee Exercises  standing hip flexion x10 each leg.     Other Standing Knee Exercises  stsnding hip 3 way       Knee/Hip Exercises: Supine   Quad Sets Limitations  quad setsx10 5 sec hold     Bridges  2 sets;10 reps    Straight Leg Raises  2 sets;10 reps;Both      Modalities   Modalities  Vasopneumatic      Vasopneumatic   Number Minutes Vasopneumatic   --    Vasopnuematic Location   --    Vasopneumatic Pressure  --    Vasopneumatic Temperature   --      Manual Therapy   Manual Therapy  --    Edema Management  --    Passive ROM  --             PT Education - 08/31/17 1102    Education provided  Yes    Education Details  treviewed HEP and symptom management     Person(s) Educated  Patient    Methods  Explanation;Demonstration    Comprehension  Verbalized understanding;Returned demonstration;Verbal cues required;Tactile cues required;Need further instruction       PT Short Term Goals - 08/17/17 1209      PT SHORT TERM GOAL #1   Title  Patient will report less  crepitus with intial standing     Time  4    Period  Weeks    Status  On-going      PT SHORT TERM GOAL #2   Title  Patient will report 3/10 pain at worst with standing     Time  4    Period  Weeks    Status  On-going      PT SHORT TERM GOAL #3   Title  Patient will demosntrate full passive knee extension     Time  4    Period  Weeks    Status  On-going        PT Long Term Goals - 07/26/17 1236      PT LONG TERM GOAL #1   Title  Patient will stand for 1/2 hour without increased pain     Time  8    Period  Weeks    Status  New    Target Date  09/20/17      PT LONG TERM GOAL #2   Title  Patient will return to the gym without pain     Time  8    Period  Weeks    Status  New    Target Date  09/20/17      PT LONG TERM GOAL #3   Title  Patient will demsotrate a 49% limitation on FOTO     Time  8    Period  Weeks    Status  New    Target Date  09/20/17            Plan - 08/31/17 1112    Clinical Impression Statement  Therapy reviewed standing exercises with the patient. She had some difficulty with single leg stance. She was advised theese exercises may be better a little bit down the road. She will attmept a trial HEP. If she feels comfortable over the nextfew weeks she will D/C. If not she may come back for a visit or 2.  Patient reports she has not been as consistent with her exercises as she should have been. She is in too much pain when she gets home.  Clinical Presentation  Evolving    Clinical Decision Making  Low    PT Frequency  2x / week    PT Duration  8 weeks    PT Treatment/Interventions  Cryotherapy;Teaching laboratory technician;Therapeutic activities;Therapeutic exercise;Patient/family education;Manual techniques;Passive range of motion;Taping    PT Next Visit Plan  continue with patellar mobs; patient dosent tolerate head well; add SLR, SAQ, heel slide; maybe a bridge; Nu-step; extension stretching if needed; review  HEP     PT Home Exercise Plan  quad set; pateallar medial glide     Consulted and Agree with Plan of Care  Patient       Patient will benefit from skilled therapeutic intervention in order to improve the following deficits and impairments:  Abnormal gait, Pain, Decreased activity tolerance, Difficulty walking, Decreased endurance, Decreased strength, Decreased range of motion  Visit Diagnosis: Chronic pain of right knee  Chronic pain of left knee  Difficulty in walking, not elsewhere classified  PHYSICAL THERAPY DISCHARGE SUMMARY  Visits from Start of Care: 3  Current functional level related to goals / functional outcomes: No significant increase in function but had not been doing her exercises very consistently      Remaining deficits: Continued pain with function    Education / Equipment: HEP   Plan: Patient agrees to discharge.  Patient goals were not met. Patient is being discharged due to not returning since the last visit.  ?????       Problem List Patient Active Problem List   Diagnosis Date Noted  . Osteoarthritis of right knee 03/16/2016  . Financial difficulties 11/02/2011  . Preventative health care 11/02/2011  . Abnormal mammogram 05/05/2009  . Hyperlipidemia LDL goal < 130 06/23/2008  . Tobacco use 05/07/2006  . Depression with anxiety 05/07/2006  . Essential hypertension 05/07/2006    Carney Living PT DPT 08/31/2017, 12:28 PM  [facility] [address] [place], [place], [address] Phone: [phone]   Fax:  [phone]  Name: Kelsey Hudson MRN: [id number] Date of Birth: 31-Dec-1955

## 2017-09-14 ENCOUNTER — Encounter: Payer: BLUE CROSS/BLUE SHIELD | Admitting: Physical Therapy

## 2017-09-28 ENCOUNTER — Encounter: Payer: Self-pay | Admitting: *Deleted

## 2017-10-11 ENCOUNTER — Encounter: Payer: BLUE CROSS/BLUE SHIELD | Admitting: Internal Medicine

## 2017-11-09 ENCOUNTER — Other Ambulatory Visit: Payer: Self-pay | Admitting: *Deleted

## 2017-11-09 DIAGNOSIS — I10 Essential (primary) hypertension: Secondary | ICD-10-CM

## 2017-11-09 MED ORDER — SIMVASTATIN 40 MG PO TABS: 40.0000 mg | ORAL_TABLET | Freq: Every evening | ORAL | 2 refills | Status: DC

## 2017-11-16 ENCOUNTER — Other Ambulatory Visit: Payer: Self-pay | Admitting: *Deleted

## 2017-11-16 DIAGNOSIS — I10 Essential (primary) hypertension: Secondary | ICD-10-CM

## 2017-11-16 MED ORDER — LISINOPRIL 10 MG PO TABS: 10.0000 mg | ORAL_TABLET | Freq: Every day | ORAL | 1 refills | Status: DC

## 2017-11-22 ENCOUNTER — Encounter: Payer: BLUE CROSS/BLUE SHIELD | Admitting: Internal Medicine

## 2018-01-10 ENCOUNTER — Ambulatory Visit: Payer: BLUE CROSS/BLUE SHIELD | Admitting: Internal Medicine

## 2018-01-10 ENCOUNTER — Encounter: Payer: Self-pay | Admitting: Internal Medicine

## 2018-01-10 ENCOUNTER — Other Ambulatory Visit: Payer: Self-pay

## 2018-01-10 VITALS — BP 137/64 | HR 59 | Temp 97.9°F | Ht 61.0 in | Wt 172.4 lb

## 2018-01-10 DIAGNOSIS — G479 Sleep disorder, unspecified: Secondary | ICD-10-CM | POA: Diagnosis not present

## 2018-01-10 DIAGNOSIS — M1711 Unilateral primary osteoarthritis, right knee: Secondary | ICD-10-CM

## 2018-01-10 DIAGNOSIS — F17210 Nicotine dependence, cigarettes, uncomplicated: Secondary | ICD-10-CM

## 2018-01-10 DIAGNOSIS — I10 Essential (primary) hypertension: Secondary | ICD-10-CM

## 2018-01-10 DIAGNOSIS — Z720 Tobacco use: Secondary | ICD-10-CM

## 2018-01-10 DIAGNOSIS — Z79899 Other long term (current) drug therapy: Secondary | ICD-10-CM

## 2018-01-10 DIAGNOSIS — Z1211 Encounter for screening for malignant neoplasm of colon: Secondary | ICD-10-CM

## 2018-01-10 DIAGNOSIS — Z0000 Encounter for general adult medical examination without abnormal findings: Secondary | ICD-10-CM

## 2018-01-10 MED ORDER — LISINOPRIL 20 MG PO TABS: 10.0000 mg | ORAL_TABLET | Freq: Every day | ORAL | 2 refills | Status: DC

## 2018-01-10 MED ORDER — DICLOFENAC SODIUM 1 % TD GEL: 2.0000 g | Freq: Four times a day (QID) | TRANSDERMAL | 3 refills | Status: DC

## 2018-01-10 NOTE — Progress Notes (Signed)
   CC: Hypertension, Osteoarthritis, Preventative Health Care  HPI:  [name] is a 62 y.o. F with PMHx listed below presenting for Hypertension, Osteoarthritis, Preventative Health Care. Please see the A&P for the status of the patient's chronic medical problems.  Past Medical History:  Diagnosis Date  . Abnormal mammogram 05/2009   Probable benign calcifications in the right breast - BIRADS 3  . Anxiety   . Carpal tunnel syndrome of left wrist 09/13/2010   Weight loss and proper positioning of the left wrist while sleeping was discussed. Patient is not willing to undergo physical therapy at this point of time secondary to financial constraints. I have asked her to at least wear a wrist splint at night which would help her rest positioning and relieve her pain and tingling.   . Depression   . Hyperlipidemia LDL goal < 130 06/23/2008   TG decreased 543 on 11/06 to 269 11/07  . Hypertension   . Insomnia   . Tobacco abuse    Review of Systems:  Performed and all others negative.  Physical Exam:  Vitals:   01/10/18 1354 01/10/18 1427  BP: (!) 154/66 137/64  Pulse: 71 (!) 59  Temp: 97.9 F (36.6 C)   TempSrc: Oral   SpO2: 97% 97%  Weight: 172 lb 6.4 oz (78.2 kg)   Height: 5\' 1"  (1.549 m)    Physical Exam  Constitutional: She appears well-developed and well-nourished. No distress.  Cardiovascular: Normal rate and regular rhythm.  Systolic murmur  Pulmonary/Chest: Effort normal and breath sounds normal. No respiratory distress.  Abdominal: Soft. Bowel sounds are normal. She exhibits no distension. There is no tenderness.  Musculoskeletal: She exhibits no edema or deformity.  Skin: Skin is warm and dry.   Assessment & Plan:   See Encounters Tab for problem based charting.  Patient discussed with Dr. Oswaldo DoneVincent

## 2018-01-10 NOTE — Patient Instructions (Addendum)
Thank you for allowing us to care for you  For your High Blood pressure - BP was elevated today - Your Lisinopril has been increased to 20mg  Daily  For your Arthritis - We have prescribed Voltaren gel to be used for your knee as needed four times a day  We are getting labs today to check your renal function and electrolytes - You will be contacted with the results  Please return for follow up in about 6 months or sooner if needed

## 2018-01-10 NOTE — Assessment & Plan Note (Addendum)
Patient continues to experience ongoing pain from osteoarthritis. 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 She has had more good days recently as she had been doing less painting (and less going up and down ladders).  She attended 1 or 2 PT session, but states that these only helped her minimally, though she has tried to continue some exercises at home.  She has been using knee braces to help support her knee although these have not been shown to improve OA pain.  She was counseled that knee injections are typically discouraged as they only typically last for 1-3 months; and are typically reserved for particular flairs.  She states that she has had good relief from topical products that she borrowed from friends including a salve and some pain relief patches. With her success with topical products and as needed aleve she will be prescribed a trial of Voltaren gel for improved targeted pain relief with lower systemic absorption. - Voltaren gel 2g PRN QID - Aleve PRN, breakthrough OA pain - Patient will also continue using knee braces

## 2018-01-10 NOTE — Assessment & Plan Note (Addendum)
BP today 154/66 and 137/64 on repeat; this is fairly consistent with from her previous of 147/63 and remains above 130 systolic. With her blood pressure remaining elevated today, we will increase Lisinopril to 20mg  Daily. We will also get BMP today at patient has not had one since 2017; to check renal function and electrolytes. - Amlodipine 10mg  Daily - Lisinopril 20mg  Daily - HCTZ 25mg  Daily -Continue to tryLow salt diet

## 2018-01-11 ENCOUNTER — Encounter: Payer: Self-pay | Admitting: Internal Medicine

## 2018-01-11 DIAGNOSIS — G479 Sleep disorder, unspecified: Secondary | ICD-10-CM | POA: Insufficient documentation

## 2018-01-11 LAB — BMP8+ANION GAP
Anion Gap: 21 mmol/L — ABNORMAL HIGH (ref 10.0–18.0)
BUN/Creatinine Ratio: 13 (ref 12–28)
BUN: 9 mg/dL (ref 8–27)
CO2: 22 mmol/L (ref 20–29)
CREATININE: 0.71 mg/dL (ref 0.57–1.00)
Calcium: 10.5 mg/dL — ABNORMAL HIGH (ref 8.7–10.3)
Chloride: 100 mmol/L (ref 96–106)
GFR calc Af Amer: 107 mL/min/{1.73_m2} (ref >59)
GFR, EST NON AFRICAN AMERICAN: 93 mL/min/{1.73_m2} (ref >59)
Glucose: 91 mg/dL (ref 65–99)
Potassium: 4.2 mmol/L (ref 3.5–5.2)
SODIUM: 143 mmol/L (ref 134–144)

## 2018-01-11 NOTE — Assessment & Plan Note (Signed)
Patient endorses sensation of rapid heart rate typically occurring at night every couple of months or so. She endorses a history of snoring and waking up short of breath raising suspicion for sleep apnea. She denies significant daytime drowsiness at this time only feeling significantly tired after a day of work.  A sleep study to evaluate for sleep apnea was recommended but patient declined at this time and stated she would like to continue to monitor herself and pay closer attention before pursuing this study. - Continue to monitor - Will offer sleep study again at follow up if symptoms persist.

## 2018-01-11 NOTE — Assessment & Plan Note (Signed)
Patient provided with card for yearly FIT test, she declined HIV screening at this time.

## 2018-01-11 NOTE — Assessment & Plan Note (Signed)
Patient states that she relapsed into smoking again a couple of times since our last visit and is currently smoking .5-1 packs per day. She states that she would like to work on quitting again on her own and declines nicotine patches at this time. She does have the number for the quit line at home - Continue to encourage smoking cessation efforts.

## 2018-01-15 ENCOUNTER — Telehealth: Payer: Self-pay | Admitting: *Deleted

## 2018-01-15 NOTE — Progress Notes (Signed)
Internal Medicine Clinic Attending  Case discussed with Dr. Melvin  at the time of the visit.  We reviewed the resident's history and exam and pertinent patient test results.  I agree with the assessment, diagnosis, and plan of care documented in the resident's note.  

## 2018-01-15 NOTE — Telephone Encounter (Signed)
PA information was sent to CoverMyMeds.  Awaiting determination within 72 hours.  Angelina OkGladys Glover Capano, RN 01/15/2018 2:53 PM.

## 2018-05-27 ENCOUNTER — Other Ambulatory Visit: Payer: Self-pay | Admitting: Internal Medicine

## 2018-05-27 DIAGNOSIS — I10 Essential (primary) hypertension: Secondary | ICD-10-CM

## 2018-05-27 MED ORDER — LISINOPRIL 20 MG PO TABS: 20.0000 mg | ORAL_TABLET | Freq: Every day | ORAL | 2 refills | Status: DC

## 2018-05-27 NOTE — Telephone Encounter (Addendum)
Refilled refused as patient has separate prescription for 20mg  Tablets and the requested refill is out of date. Have updated prescription for 20mg  Tablet to be taken as 20mg  Daily as planned and recommended at last visit.

## 2018-06-07 ENCOUNTER — Other Ambulatory Visit: Payer: Self-pay | Admitting: *Deleted

## 2018-06-07 DIAGNOSIS — I10 Essential (primary) hypertension: Secondary | ICD-10-CM

## 2018-06-07 MED ORDER — HYDROCHLOROTHIAZIDE 25 MG PO TABS: 25.0000 mg | ORAL_TABLET | Freq: Every day | ORAL | 2 refills | Status: DC

## 2018-06-07 NOTE — Telephone Encounter (Signed)
Refill approved.

## 2018-07-03 ENCOUNTER — Other Ambulatory Visit: Payer: Self-pay

## 2018-07-03 DIAGNOSIS — I10 Essential (primary) hypertension: Secondary | ICD-10-CM

## 2018-07-03 MED ORDER — AMLODIPINE BESYLATE 10 MG PO TABS: 10.0000 mg | ORAL_TABLET | Freq: Every day | ORAL | 2 refills | Status: DC

## 2018-07-03 NOTE — Telephone Encounter (Signed)
Refill approved.

## 2018-07-03 NOTE — Telephone Encounter (Signed)
amLODipine (NORVASC) 10 MG tablet, refill request.  

## 2018-07-05 ENCOUNTER — Other Ambulatory Visit: Payer: Self-pay | Admitting: Internal Medicine

## 2018-08-12 ENCOUNTER — Other Ambulatory Visit: Payer: Self-pay | Admitting: *Deleted

## 2018-08-12 DIAGNOSIS — I10 Essential (primary) hypertension: Secondary | ICD-10-CM

## 2018-08-12 MED ORDER — SIMVASTATIN 40 MG PO TABS: 40.0000 mg | ORAL_TABLET | Freq: Every evening | ORAL | 2 refills | Status: DC

## 2018-10-03 ENCOUNTER — Encounter: Payer: BLUE CROSS/BLUE SHIELD | Admitting: Internal Medicine

## 2018-10-17 ENCOUNTER — Ambulatory Visit (INDEPENDENT_AMBULATORY_CARE_PROVIDER_SITE_OTHER): Payer: BLUE CROSS/BLUE SHIELD | Admitting: Internal Medicine

## 2018-10-17 ENCOUNTER — Other Ambulatory Visit: Payer: Self-pay

## 2018-10-17 ENCOUNTER — Encounter: Payer: Self-pay | Admitting: Internal Medicine

## 2018-10-17 DIAGNOSIS — R635 Abnormal weight gain: Secondary | ICD-10-CM

## 2018-10-17 DIAGNOSIS — M1711 Unilateral primary osteoarthritis, right knee: Secondary | ICD-10-CM | POA: Diagnosis not present

## 2018-10-17 DIAGNOSIS — J302 Other seasonal allergic rhinitis: Secondary | ICD-10-CM

## 2018-10-17 DIAGNOSIS — F418 Other specified anxiety disorders: Secondary | ICD-10-CM | POA: Diagnosis not present

## 2018-10-17 DIAGNOSIS — I10 Essential (primary) hypertension: Secondary | ICD-10-CM

## 2018-10-17 DIAGNOSIS — G479 Sleep disorder, unspecified: Secondary | ICD-10-CM | POA: Diagnosis not present

## 2018-10-17 NOTE — Assessment & Plan Note (Signed)
Patient reports history of seasonal runny nose that was significantly worse. She describes symptoms of seasonal allergies with possible superimposed viral URI, which have improved. She has congestion, drainage, and cough without fever.  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 She started Flonase on recommendation of her daughter who has allergies, which heped her symptoms, she then had further improvement with zyrtec. She states her symptoms are well controlled now but she get a dry nose. She was informed to try zyrtec daily and Flonase PRN due to nasal symptoms - Zyrtec 10mg  Daily - Flonase PRN

## 2018-10-17 NOTE — Progress Notes (Signed)
  This is a telephone encounter between Kelsey Hudson and Kelsey Hudson on 10/17/2018. The visit was conducted with the patient located at home and Kelsey Hudson at [facility]. The patient's identity was confirmed using their DOB and current address. The patient has consented to being evaluated through a telephone encounter and understands the associated risks (an examination cannot be done and the patient may need to come in for an appointment) / benefits (allows the patient to remain at home, decreasing exposure to coronavirus). I personally spent 22 minutes on medical discussion.  CC: HTN, OA, Allergies, Depression, Sleep Disturbance  HPI:   [name] is a 63 y.o. F with PMHx listed below presenting for HTN, OA, Allergies, Depression, Sleep Disturbance. Please see the A&P for the status of the patient's chronic medical problems.   Past Medical History:  Diagnosis Date  . Abnormal mammogram 05/2009   Probable benign calcifications in the right breast - BIRADS 3  . Anxiety   . Carpal tunnel syndrome of left wrist 09/13/2010   Weight loss and proper positioning of the left wrist while sleeping was discussed. Patient is not willing to undergo physical therapy at this point of time secondary to financial constraints. I have asked her to at least wear a wrist splint at night which would help her rest positioning and relieve her pain and tingling.   . Depression   . Hyperlipidemia LDL goal < 130 06/23/2008   TG decreased 543 on 11/06 to 269 11/07  . Hypertension   . Insomnia   . Tobacco abuse    Review of Systems:  Performed and all others negative.  Physical Exam:  There were no vitals filed for this visit. Not performed for tele-health visit  Assessment & Plan:   See Encounters Tab for problem based charting.  Patient discussed with Dr. Criselda Peaches

## 2018-10-17 NOTE — Assessment & Plan Note (Signed)
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 She tried Voltaren gel, which was not very effective for her, so we will discontinue this. She states she had relief with a friend's lidocaine patch, so we will consider this in the future if she has need.  Knee injections have been discussed in the past as possibility if other interventions fail, but it can end up being a temporary measure while looking into surgical options. - Continue PRN Aleve - Consider lidocaine patches if needed

## 2018-10-17 NOTE — Assessment & Plan Note (Signed)
BP at home has ranged in the 140s-150s systolic. BP at last visit was 154/66 and 137/64 on repeat.  She was supposed to increase her dose of Lisinopril to 20mg  Daily, but due to prescription error, the strength was changed but not the dose initially. She had been cutting 20mg  tablets in half. She was instructed to take 20mg  daily going forward and her medications in chart were reviewed. -Amlodipine 10mg Daily -Increase to Lisinopril 20mg Daily -HCTZ 25mg Daily -Continue to tryLow salt diet

## 2018-10-17 NOTE — Assessment & Plan Note (Signed)
Patient discussed weight gain over the years and plan to start diet and exercise to improve her weight and health, she was encouraged in her efforts.  She was inspired by her daughter's father in law who has lost weight with diet and been able to come off some medications - Continue to monitor and assist as needed, consider nutrition referral

## 2018-10-17 NOTE — Assessment & Plan Note (Signed)
Patient continues to follow with psychiatry. She has stopped taking Seroquel. She is currently on Remeron as prescribed by psychiatry. Her symptoms remain well controlled. - Continue Remeron 15mg  qhs - Continue f/u with psychiatry

## 2018-10-17 NOTE — Assessment & Plan Note (Signed)
Patient states her sleep has improved and she has not been waking up short of breath. She states she has been taking Zzzquil intermittently and has some melatonin but has not used it much. It was recommended that she try to use melatonin as her primary sleep aid and use Zzz PRN if this is not working. - Melatonin OTC qhs - Zzzquil PRN

## 2018-10-23 NOTE — Progress Notes (Signed)
Internal Medicine Clinic Attending  Case discussed with Dr. Melvin soon after the resident saw the patient.  We reviewed the resident's history, telephone conversation and pertinent patient test results.  I agree with the assessment, diagnosis, and plan of care documented in the resident's note.   

## 2019-01-10 ENCOUNTER — Other Ambulatory Visit: Payer: Self-pay | Admitting: Internal Medicine

## 2019-01-10 DIAGNOSIS — I10 Essential (primary) hypertension: Secondary | ICD-10-CM

## 2019-01-13 NOTE — Telephone Encounter (Signed)
Refills approved.

## 2019-03-02 ENCOUNTER — Other Ambulatory Visit: Payer: Self-pay | Admitting: Internal Medicine

## 2019-03-02 DIAGNOSIS — I10 Essential (primary) hypertension: Secondary | ICD-10-CM

## 2019-03-05 NOTE — Telephone Encounter (Signed)
Refill approved.

## 2019-03-17 ENCOUNTER — Ambulatory Visit: Payer: BLUE CROSS/BLUE SHIELD | Admitting: Internal Medicine

## 2019-03-17 ENCOUNTER — Encounter: Payer: Self-pay | Admitting: Internal Medicine

## 2019-03-17 ENCOUNTER — Other Ambulatory Visit: Payer: Self-pay

## 2019-03-17 VITALS — BP 137/71 | HR 64 | Temp 98.1°F | Ht 61.0 in | Wt 177.9 lb

## 2019-03-17 DIAGNOSIS — Z1239 Encounter for other screening for malignant neoplasm of breast: Secondary | ICD-10-CM

## 2019-03-17 DIAGNOSIS — M62838 Other muscle spasm: Secondary | ICD-10-CM

## 2019-03-17 DIAGNOSIS — I10 Essential (primary) hypertension: Secondary | ICD-10-CM | POA: Diagnosis not present

## 2019-03-17 DIAGNOSIS — M1711 Unilateral primary osteoarthritis, right knee: Secondary | ICD-10-CM

## 2019-03-17 DIAGNOSIS — Z79899 Other long term (current) drug therapy: Secondary | ICD-10-CM

## 2019-03-17 DIAGNOSIS — E785 Hyperlipidemia, unspecified: Secondary | ICD-10-CM | POA: Diagnosis not present

## 2019-03-17 DIAGNOSIS — Z720 Tobacco use: Secondary | ICD-10-CM

## 2019-03-17 DIAGNOSIS — Z1211 Encounter for screening for malignant neoplasm of colon: Secondary | ICD-10-CM

## 2019-03-17 DIAGNOSIS — Z0000 Encounter for general adult medical examination without abnormal findings: Secondary | ICD-10-CM

## 2019-03-17 DIAGNOSIS — R011 Cardiac murmur, unspecified: Secondary | ICD-10-CM

## 2019-03-17 MED ORDER — LISINOPRIL 40 MG PO TABS: 40.0000 mg | ORAL_TABLET | Freq: Every day | ORAL | 2 refills | Status: DC

## 2019-03-17 NOTE — Assessment & Plan Note (Signed)
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 She has good control with as needed NSAIDs. - Continue PRN Aleve - Consider Lidocaine patches if needed

## 2019-03-17 NOTE — Progress Notes (Signed)
   CC: Hypertension, Preventative Health Care, Lip twitching, Hyperlipidemia, OA  HPI:  Kelsey Hudson is a 63 y.o. F with PMHx listed below presenting for Hypertension, Preventative Health Care, Lip twitching, Hyperlipidemia, OA. Please see the A&P for the status of the patient's chronic medical problems.  Past Medical History:  Diagnosis Date  . Abnormal mammogram 05/2009   Probable benign calcifications in the right breast - BIRADS 3  . Anxiety   . Carpal tunnel syndrome of left wrist 09/13/2010   Weight loss and proper positioning of the left wrist while sleeping was discussed. Patient is not willing to undergo physical therapy at this point of time secondary to financial constraints. I have asked her to at least wear a wrist splint at night which would help her rest positioning and relieve her pain and tingling.   . Depression   . Hyperlipidemia LDL goal < 130 06/23/2008   TG decreased 543 on 11/06 to 269 11/07  . Hypertension   . Insomnia   . Tobacco abuse    Review of Systems:  Performed and all others negative.  Physical Exam:  Vitals:   03/17/19 1319  BP: 137/71  Pulse: 64  Temp: 98.1 F (36.7 C)  TempSrc: Oral  SpO2: 99%  Weight: 177 lb 14.4 oz (80.7 kg)  Height: 5\' 1"  (1.549 m)   Physical Exam Constitutional:      General: She is not in acute distress.    Appearance: Normal appearance. She is obese.  Cardiovascular:     Rate and Rhythm: Normal rate and regular rhythm.     Pulses: Normal pulses.     Heart sounds: Murmur present.  Pulmonary:     Effort: Pulmonary effort is normal. No respiratory distress.     Breath sounds: Normal breath sounds.  Abdominal:     General: Bowel sounds are normal. There is no distension.     Palpations: Abdomen is soft.     Tenderness: There is no abdominal tenderness.  Musculoskeletal:        General: No swelling or deformity.  Skin:    General: Skin is warm and dry.  Neurological:     General: No focal deficit  present.     Mental Status: Mental status is at baseline.    Assessment & Plan:   See Encounters Tab for problem based charting.   Patient discussed with Dr. Evette Doffing

## 2019-03-17 NOTE — Assessment & Plan Note (Signed)
BP is stable at 137/64 today. Will increase Lisinopril and recheck at follow up. Will get repeat labs today given they were last checked almost 1 year ago. If Ca is elevated will need to consider and alternative to HCTZ.  - Amlodipine 10mg  Daily - Increase Lisinopril to 40mg  Daily - HCTZ 25mg  Daily - BMP - Lifestyle interventions

## 2019-03-17 NOTE — Assessment & Plan Note (Signed)
-   Screening Mammogram ordered - Screening FOBT ordered - Patient declines flu shot and HIV screen

## 2019-03-17 NOTE — Patient Instructions (Signed)
Thank you for allowing [place] to care for you  For your high blood pressure - BP improved today to 137/71, but remains elevated - We will increase your dose of Lisinopril to 40mg  Daily - Please follow up in 1-2 months for recheck - Lab work today, you will be contacted with the results  We have ordered a mammogram and Colon screening

## 2019-03-17 NOTE — Assessment & Plan Note (Signed)
Patient continues to tolerate Simvastatin, No refills needed. - Simvastatin 40mg  Daily

## 2019-03-17 NOTE — Assessment & Plan Note (Signed)
Patient describes upper lip twitching at rest. This may be due to E-lyte abnormality or medication side effect. Will evaluate with BMP today and have her discuss with her psychiatrist as well. - BMP - F/U with psychiatry

## 2019-03-17 NOTE — Assessment & Plan Note (Signed)
Patient continues to smoke. She has some interest in quitting and states her son recently quit. She states she needs to get her head right first. She has an upcoming behavioral health visit. We will revisit this topic at follow up. - Continue to monitor

## 2019-03-18 ENCOUNTER — Encounter: Payer: Self-pay | Admitting: Internal Medicine

## 2019-03-18 LAB — BMP8+ANION GAP
Anion Gap: 18 mmol/L (ref 10.0–18.0)
BUN/Creatinine Ratio: 15 (ref 12–28)
BUN: 12 mg/dL (ref 8–27)
CO2: 22 mmol/L (ref 20–29)
Calcium: 10.7 mg/dL — ABNORMAL HIGH (ref 8.7–10.3)
Chloride: 97 mmol/L (ref 96–106)
Creatinine, Ser: 0.82 mg/dL (ref 0.57–1.00)
GFR calc Af Amer: 89 mL/min/{1.73_m2} (ref >59)
GFR calc non Af Amer: 77 mL/min/{1.73_m2} (ref >59)
Glucose: 91 mg/dL (ref 65–99)
Potassium: 4.7 mmol/L (ref 3.5–5.2)
Sodium: 137 mmol/L (ref 134–144)

## 2019-03-18 NOTE — Progress Notes (Signed)
Patient mailed letter informing her of her results showing a mildly elevated Calcium and otherwise normal BMP.

## 2019-03-18 NOTE — Progress Notes (Signed)
Internal Medicine Clinic Attending  Case discussed with Dr. Melvin  at the time of the visit.  We reviewed the resident's history and exam and pertinent patient test results.  I agree with the assessment, diagnosis, and plan of care documented in the resident's note.  

## 2019-03-31 ENCOUNTER — Other Ambulatory Visit: Payer: BLUE CROSS/BLUE SHIELD

## 2019-03-31 ENCOUNTER — Other Ambulatory Visit: Payer: Self-pay

## 2019-03-31 DIAGNOSIS — Z1211 Encounter for screening for malignant neoplasm of colon: Secondary | ICD-10-CM

## 2019-04-01 LAB — FECAL OCCULT BLOOD, IMMUNOCHEMICAL: Fecal Occult Bld: NEGATIVE

## 2019-04-13 ENCOUNTER — Other Ambulatory Visit: Payer: Self-pay | Admitting: Internal Medicine

## 2019-04-13 DIAGNOSIS — I10 Essential (primary) hypertension: Secondary | ICD-10-CM

## 2019-04-15 NOTE — Telephone Encounter (Signed)
Refill approved.

## 2019-04-21 ENCOUNTER — Telehealth: Payer: Self-pay | Admitting: *Deleted

## 2019-04-21 NOTE — Telephone Encounter (Signed)
Patient called in stating since last OV when lisinopril was increased to 40 mg she has been "so dizzy and feel like I'm going to pass out." States these symptoms last all day and not just with position changes; she often feels the need to lie down. Home BP readings were 99/55, 99/69, 99/67. Last week she did not take amlodipine 10 mg in addition to lisinopril 40 mg x 4 days and felt much better. Home BPs at that time 119/73, 140/70, 123/72, 147/76. Patient states she will continue the lisinopril 40 mg and HCTZ but will not continue the amlodipine. First available appt made to f/u with PCP on 05/15/2019. Please advise. Hubbard Hartshorn, BSN, RN-BC

## 2019-04-22 NOTE — Telephone Encounter (Signed)
Yes, If symptoms persist, I would she should be seen sooner.  Thank you  Pearson Grippe

## 2019-04-25 NOTE — Telephone Encounter (Signed)
Call placed to patient to learn how she has been doing. She states she resumed amlodipine 10 mg daily and went back to lisinopril 20 mg daily vs 40 mg. States BP on this regimen was 119/78. Notes she feels much better and even her husband has noticed how much better she is doing. Confirmed upcoming appt with PCP 05/15/2019. She is aware she can schedule sooner [facility] visit if she needs to. Hubbard Hartshorn, BSN, RN-BC

## 2019-05-15 ENCOUNTER — Other Ambulatory Visit: Payer: Self-pay

## 2019-05-15 ENCOUNTER — Ambulatory Visit (INDEPENDENT_AMBULATORY_CARE_PROVIDER_SITE_OTHER): Payer: BLUE CROSS/BLUE SHIELD | Admitting: Internal Medicine

## 2019-05-15 DIAGNOSIS — I10 Essential (primary) hypertension: Secondary | ICD-10-CM

## 2019-05-15 DIAGNOSIS — Z79899 Other long term (current) drug therapy: Secondary | ICD-10-CM

## 2019-05-15 MED ORDER — LISINOPRIL 20 MG PO TABS: 20.0000 mg | ORAL_TABLET | Freq: Every day | ORAL | 0 refills | Status: AC

## 2019-05-15 NOTE — Assessment & Plan Note (Signed)
The patient states that her blood pressure has 90-130s/50-80s. She is being prescribed lisinopril 40mg  qd, hctz 25mg  qd, amlodipine 10mg  qd.   The patient states that at last visit in October her lisinopril was increased to 40mg  and subsequently she has been feeling dizzy and felt that she was going to "pass out". She decreased the dose to lisinopril 20mg  qd and states that she is doing well otherwise.   Assessment and plan  Decreased lisinopril to 20mg  qd. Continue amlodipine 10mg  and hctz 24mg  qd. Follow up in 4 weeks.

## 2019-05-15 NOTE — Progress Notes (Signed)
  [facility] Res[facility] Telephone Encounter Continuity Care Appointment  HPI:   This telephone encounter was created for Kelsey Hudson on 05/15/2019 for the following purpose/cc blood pressure follow up and medication management.   Past Medical History:  Past Medical History:  Diagnosis Date  . Abnormal mammogram 05/2009   Probable benign calcifications in the right breast - BIRADS 3  . Anxiety   . Carpal tunnel syndrome of left wrist 09/13/2010   Weight loss and proper positioning of the left wrist while sleeping was discussed. Patient is not willing to undergo physical therapy at this point of time secondary to financial constraints. I have asked her to at least wear a wrist splint at night which would help her rest positioning and relieve her pain and tingling.   . Depression   . Hyperlipidemia LDL goal < 130 06/23/2008   TG decreased 543 on 11/06 to 269 11/07  . Hypertension   . Insomnia   . Tobacco abuse       ROS:   Dizziness, lightheadedness   Assessment / Plan / Recommendations:   Please see A&P under problem oriented charting for assessment of the patient's acute and chronic medical conditions.   As always, pt is advised that if symptoms worsen or new symptoms arise, they should go to an urgent care facility or to to ER for further evaluation.   Consent and Medical Decision Making:   Patient discussed with Dr. Dareen Piano  This is a telephone encounter between DARIANN HUCKABA and Ginna Schuur on 05/15/2019 for blood pressure follow up. The visit was conducted with the patient located at home and Syeda Prickett at [facility]. The patient's identity was confirmed using their DOB and current address. The patient has consented to being evaluated through a telephone encounter and understands the associated risks (an examination cannot be done and the patient may need to come in for an appointment) / benefits (allows the patient to remain at home, decreasing  exposure to coronavirus). I personally spent 10 minutes on medical discussion.

## 2019-05-15 NOTE — Progress Notes (Signed)
Internal Medicine Clinic Attending  Case discussed with Dr. Chundi at the time of the visit.  We reviewed the resident's history and exam and pertinent patient test results.  I agree with the assessment, diagnosis, and plan of care documented in the resident's note. 

## 2019-06-03 ENCOUNTER — Ambulatory Visit
Admission: RE | Admit: 2019-06-03 | Discharge: 2019-06-03 | Disposition: A | Discharge status: Home / Self Care | Payer: BLUE CROSS/BLUE SHIELD | Source: Ambulatory Visit | Attending: Internal Medicine | Admitting: Internal Medicine

## 2019-06-03 ENCOUNTER — Other Ambulatory Visit: Payer: Self-pay

## 2019-06-03 DIAGNOSIS — Z1239 Encounter for other screening for malignant neoplasm of breast: Secondary | ICD-10-CM

## 2019-06-09 ENCOUNTER — Other Ambulatory Visit: Payer: Self-pay | Admitting: Internal Medicine

## 2019-06-09 DIAGNOSIS — I10 Essential (primary) hypertension: Secondary | ICD-10-CM

## 2019-10-03 ENCOUNTER — Encounter: Payer: Self-pay | Admitting: *Deleted

## 2020-05-05 ENCOUNTER — Other Ambulatory Visit: Payer: Self-pay | Admitting: Nurse Practitioner

## 2020-05-05 DIAGNOSIS — Z1231 Encounter for screening mammogram for malignant neoplasm of breast: Secondary | ICD-10-CM

## 2020-06-17 ENCOUNTER — Ambulatory Visit: Payer: BLUE CROSS/BLUE SHIELD

## 2021-05-12 IMAGING — MG DIGITAL SCREENING BILAT W/ CAD
4 series · 4 of 4 positions shown · non-contrast
Comparison: Previous exam(s).

CLINICAL DATA: Screening.

EXAM:
DIGITAL SCREENING BILATERAL MAMMOGRAM WITH CAD

[L CC]
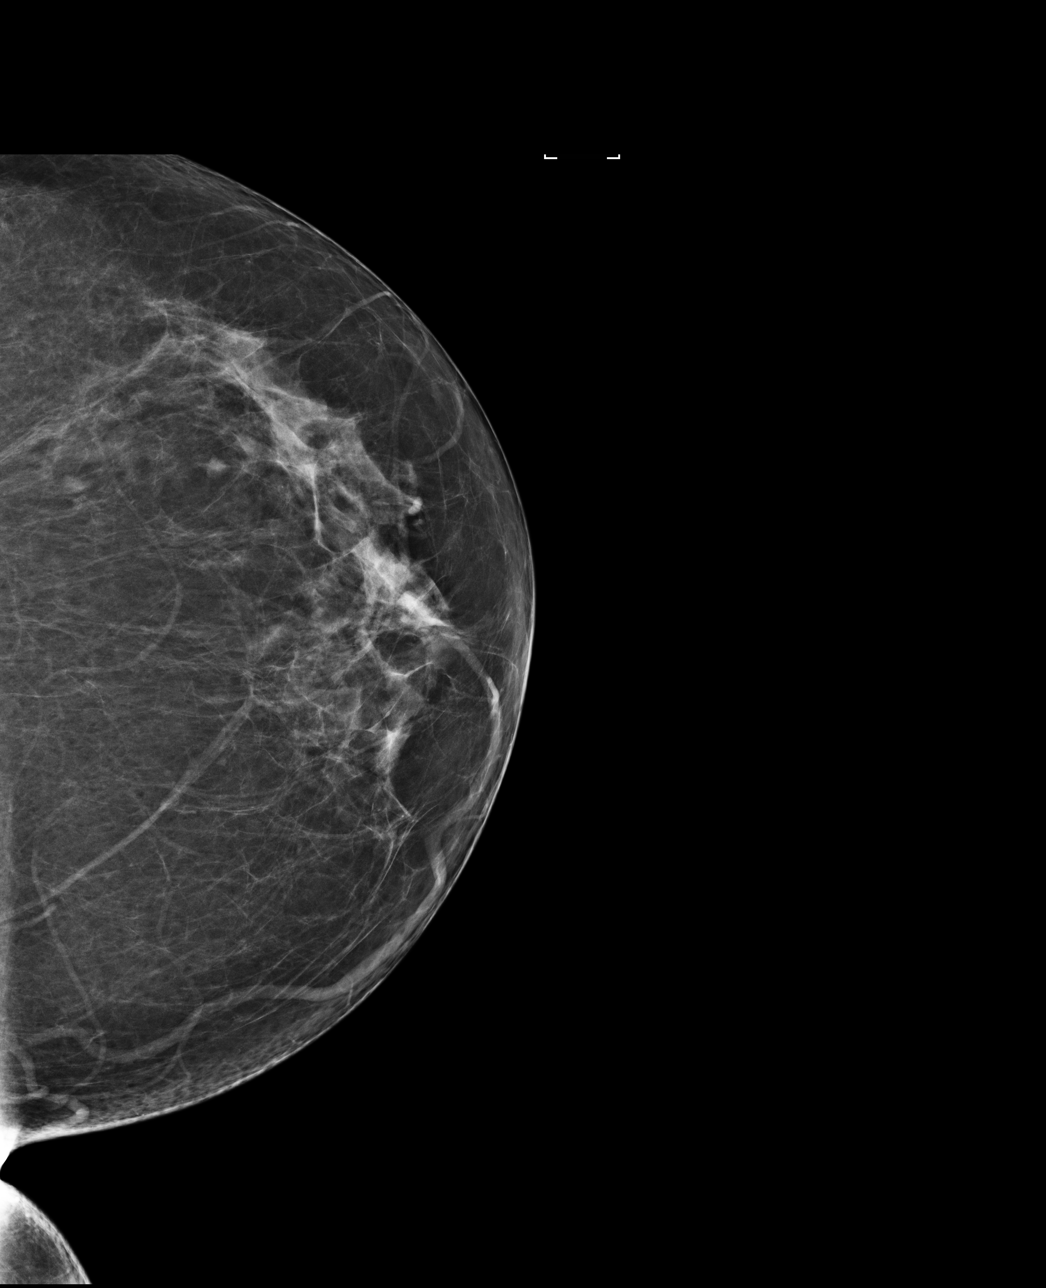

[R CC]
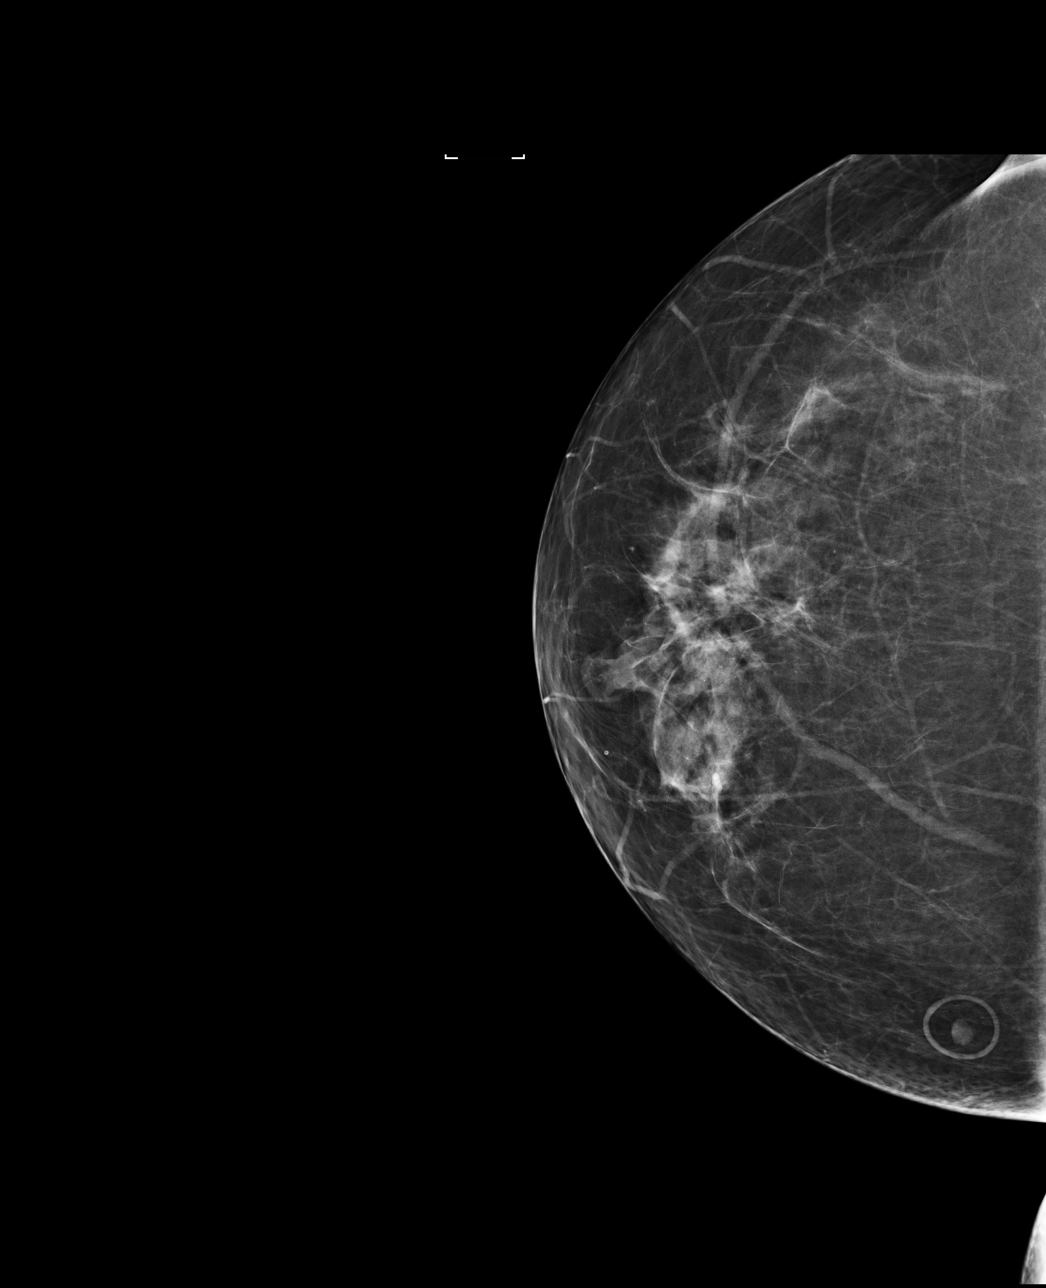

[R MLO]
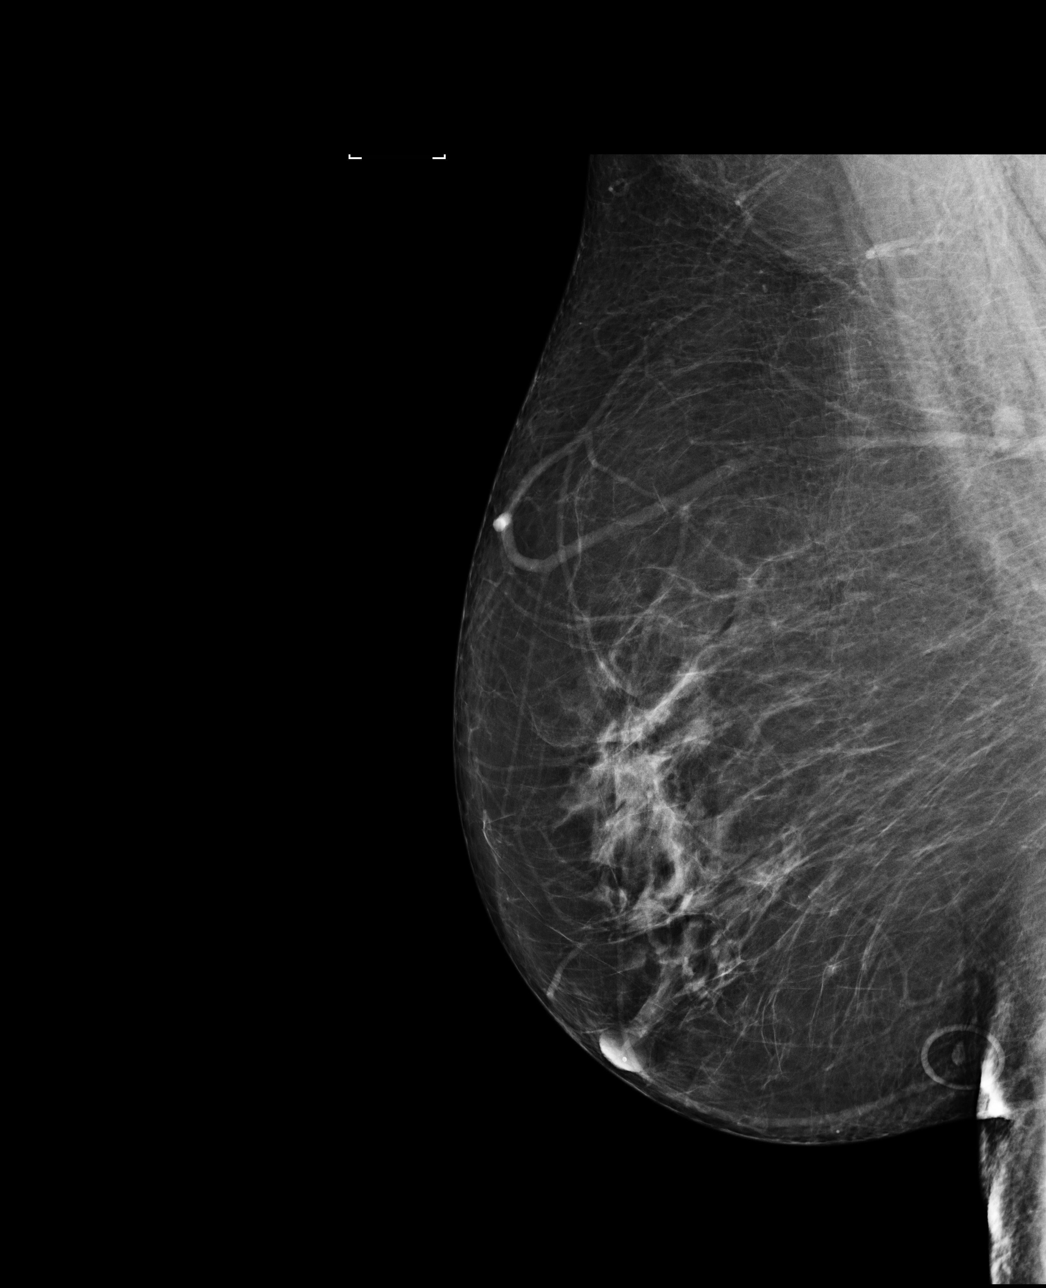

[L MLO]
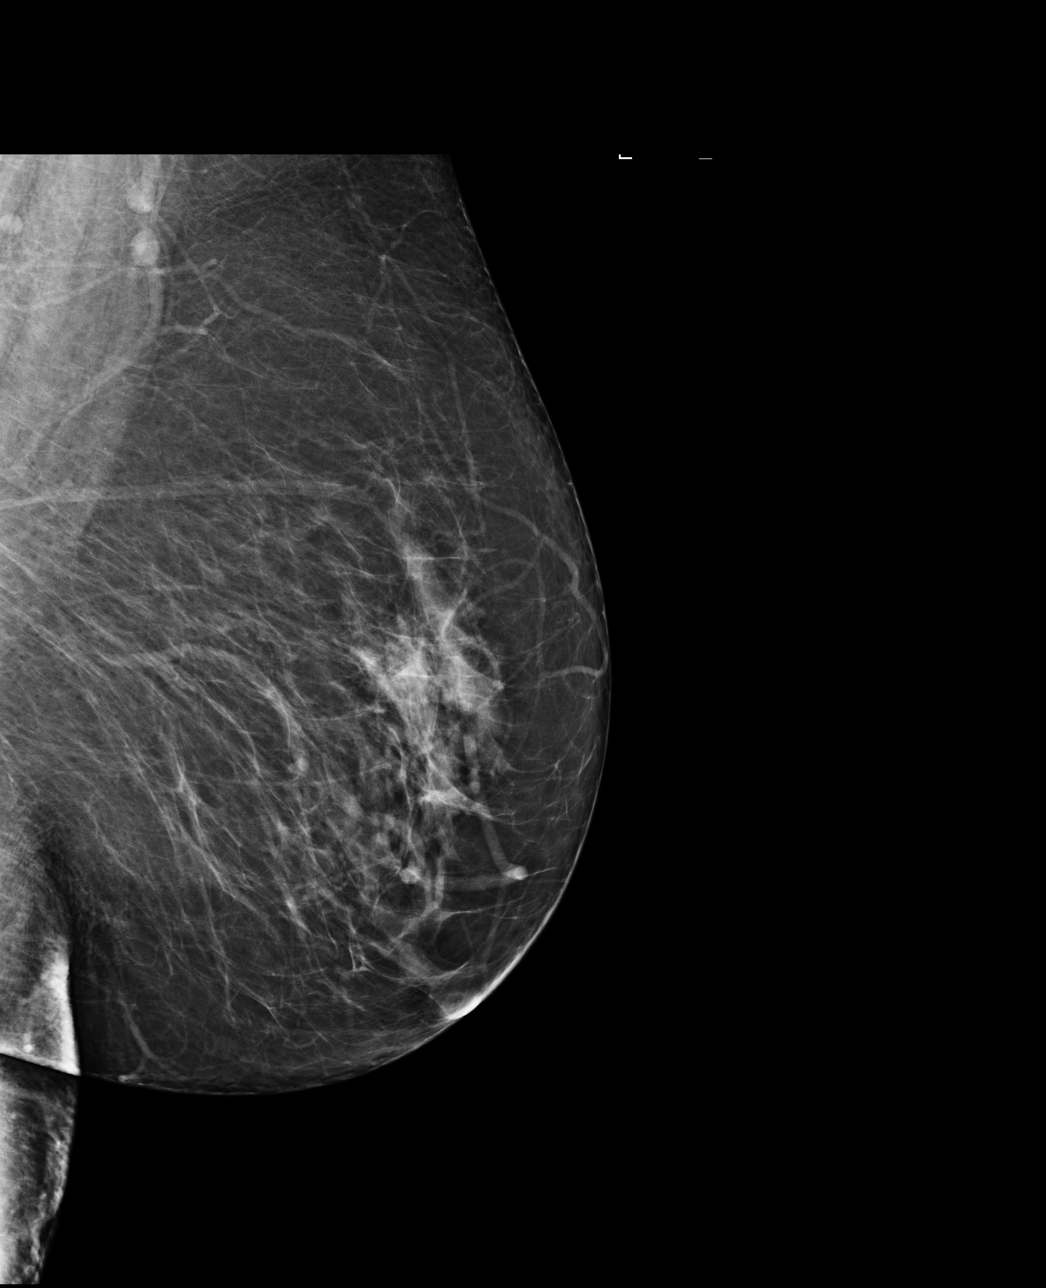

[4 of 4 positions shown; findings below may reference images not displayed]

ACR Breast Density Category b: There are scattered areas of
fibroglandular density.
FINDINGS: There are no findings suspicious for malignancy. Images were
processed with CAD.
IMPRESSION: No mammographic evidence of malignancy. A result letter of this
screening mammogram will be mailed directly to the patient.

RECOMMENDATION:
Screening mammogram in one year. (Code:AS-G-LCT)

BI-RADS CATEGORY  1: Negative.

## 2022-01-04 ENCOUNTER — Other Ambulatory Visit: Payer: Self-pay | Admitting: Physician Assistant

## 2022-01-04 DIAGNOSIS — Z1231 Encounter for screening mammogram for malignant neoplasm of breast: Secondary | ICD-10-CM

## 2022-02-23 ENCOUNTER — Other Ambulatory Visit: Payer: Self-pay | Admitting: Physician Assistant

## 2022-02-23 DIAGNOSIS — Z1231 Encounter for screening mammogram for malignant neoplasm of breast: Secondary | ICD-10-CM

## 2022-03-29 ENCOUNTER — Ambulatory Visit: Payer: BLUE CROSS/BLUE SHIELD

## 2022-04-18 ENCOUNTER — Ambulatory Visit
Admission: RE | Admit: 2022-04-18 | Discharge: 2022-04-18 | Disposition: A | Discharge status: Home / Self Care | Payer: No Typology Code available for payment source | Source: Ambulatory Visit | Attending: Physician Assistant | Admitting: Physician Assistant

## 2022-04-18 ENCOUNTER — Other Ambulatory Visit: Payer: Self-pay | Admitting: Physician Assistant

## 2022-04-18 DIAGNOSIS — Z1231 Encounter for screening mammogram for malignant neoplasm of breast: Secondary | ICD-10-CM

## 2022-11-30 DIAGNOSIS — I4510 Unspecified right bundle-branch block: Secondary | ICD-10-CM | POA: Diagnosis not present

## 2022-11-30 DIAGNOSIS — I4891 Unspecified atrial fibrillation: Secondary | ICD-10-CM | POA: Diagnosis not present

## 2022-12-01 DIAGNOSIS — I361 Nonrheumatic tricuspid (valve) insufficiency: Secondary | ICD-10-CM | POA: Diagnosis not present

## 2022-12-01 DIAGNOSIS — I4510 Unspecified right bundle-branch block: Secondary | ICD-10-CM | POA: Diagnosis not present

## 2022-12-01 DIAGNOSIS — I340 Nonrheumatic mitral (valve) insufficiency: Secondary | ICD-10-CM | POA: Diagnosis not present

## 2022-12-13 ENCOUNTER — Encounter: Payer: Self-pay | Admitting: Internal Medicine

## 2022-12-18 ENCOUNTER — Encounter: Payer: Self-pay | Admitting: Internal Medicine

## 2023-02-12 DIAGNOSIS — I4891 Unspecified atrial fibrillation: Secondary | ICD-10-CM | POA: Diagnosis not present

## 2023-02-12 DIAGNOSIS — E782 Mixed hyperlipidemia: Secondary | ICD-10-CM | POA: Diagnosis not present

## 2023-02-12 DIAGNOSIS — I10 Essential (primary) hypertension: Secondary | ICD-10-CM | POA: Diagnosis not present

## 2023-02-12 DIAGNOSIS — F17200 Nicotine dependence, unspecified, uncomplicated: Secondary | ICD-10-CM | POA: Diagnosis not present

## 2023-02-21 DIAGNOSIS — I4510 Unspecified right bundle-branch block: Secondary | ICD-10-CM | POA: Diagnosis not present

## 2023-02-21 DIAGNOSIS — I480 Paroxysmal atrial fibrillation: Secondary | ICD-10-CM | POA: Diagnosis not present

## 2023-03-21 DIAGNOSIS — I493 Ventricular premature depolarization: Secondary | ICD-10-CM | POA: Diagnosis not present

## 2023-03-21 DIAGNOSIS — E782 Mixed hyperlipidemia: Secondary | ICD-10-CM | POA: Diagnosis not present

## 2023-03-21 DIAGNOSIS — R011 Cardiac murmur, unspecified: Secondary | ICD-10-CM | POA: Diagnosis not present

## 2023-03-21 DIAGNOSIS — Z720 Tobacco use: Secondary | ICD-10-CM | POA: Diagnosis not present

## 2023-03-21 DIAGNOSIS — I480 Paroxysmal atrial fibrillation: Secondary | ICD-10-CM | POA: Diagnosis not present

## 2023-03-21 DIAGNOSIS — I119 Hypertensive heart disease without heart failure: Secondary | ICD-10-CM | POA: Diagnosis not present

## 2023-04-23 DIAGNOSIS — I080 Rheumatic disorders of both mitral and aortic valves: Secondary | ICD-10-CM | POA: Diagnosis not present

## 2023-05-09 DIAGNOSIS — M1712 Unilateral primary osteoarthritis, left knee: Secondary | ICD-10-CM | POA: Diagnosis not present

## 2023-05-09 DIAGNOSIS — I4891 Unspecified atrial fibrillation: Secondary | ICD-10-CM | POA: Diagnosis not present

## 2023-05-09 DIAGNOSIS — E785 Hyperlipidemia, unspecified: Secondary | ICD-10-CM | POA: Diagnosis not present

## 2023-05-09 DIAGNOSIS — I10 Essential (primary) hypertension: Secondary | ICD-10-CM | POA: Diagnosis not present

## 2023-05-09 DIAGNOSIS — B3731 Acute candidiasis of vulva and vagina: Secondary | ICD-10-CM | POA: Diagnosis not present

## 2023-05-09 DIAGNOSIS — I700 Atherosclerosis of aorta: Secondary | ICD-10-CM | POA: Diagnosis not present

## 2023-06-22 DIAGNOSIS — I480 Paroxysmal atrial fibrillation: Secondary | ICD-10-CM | POA: Diagnosis not present

## 2023-06-22 DIAGNOSIS — E782 Mixed hyperlipidemia: Secondary | ICD-10-CM | POA: Diagnosis not present

## 2023-06-22 DIAGNOSIS — I493 Ventricular premature depolarization: Secondary | ICD-10-CM | POA: Diagnosis not present

## 2023-06-22 DIAGNOSIS — Z720 Tobacco use: Secondary | ICD-10-CM | POA: Diagnosis not present

## 2023-06-22 DIAGNOSIS — R011 Cardiac murmur, unspecified: Secondary | ICD-10-CM | POA: Diagnosis not present

## 2023-06-22 DIAGNOSIS — I119 Hypertensive heart disease without heart failure: Secondary | ICD-10-CM | POA: Diagnosis not present

## 2023-07-06 DIAGNOSIS — R110 Nausea: Secondary | ICD-10-CM | POA: Diagnosis not present

## 2023-07-06 DIAGNOSIS — J40 Bronchitis, not specified as acute or chronic: Secondary | ICD-10-CM | POA: Diagnosis not present

## 2023-07-06 DIAGNOSIS — R6889 Other general symptoms and signs: Secondary | ICD-10-CM | POA: Diagnosis not present

## 2023-07-18 DIAGNOSIS — R059 Cough, unspecified: Secondary | ICD-10-CM | POA: Diagnosis not present

## 2023-07-18 DIAGNOSIS — J019 Acute sinusitis, unspecified: Secondary | ICD-10-CM | POA: Diagnosis not present

## 2023-08-08 DIAGNOSIS — Z87891 Personal history of nicotine dependence: Secondary | ICD-10-CM | POA: Diagnosis not present

## 2023-08-08 DIAGNOSIS — E785 Hyperlipidemia, unspecified: Secondary | ICD-10-CM | POA: Diagnosis not present

## 2023-08-08 DIAGNOSIS — I10 Essential (primary) hypertension: Secondary | ICD-10-CM | POA: Diagnosis not present

## 2023-08-08 DIAGNOSIS — I4891 Unspecified atrial fibrillation: Secondary | ICD-10-CM | POA: Diagnosis not present

## 2023-08-08 DIAGNOSIS — I517 Cardiomegaly: Secondary | ICD-10-CM | POA: Diagnosis not present

## 2023-08-08 DIAGNOSIS — M1712 Unilateral primary osteoarthritis, left knee: Secondary | ICD-10-CM | POA: Diagnosis not present

## 2023-09-13 DIAGNOSIS — I480 Paroxysmal atrial fibrillation: Secondary | ICD-10-CM | POA: Diagnosis not present

## 2023-09-13 DIAGNOSIS — R011 Cardiac murmur, unspecified: Secondary | ICD-10-CM | POA: Diagnosis not present

## 2023-09-13 DIAGNOSIS — I517 Cardiomegaly: Secondary | ICD-10-CM | POA: Diagnosis not present

## 2023-09-14 DIAGNOSIS — F17210 Nicotine dependence, cigarettes, uncomplicated: Secondary | ICD-10-CM | POA: Diagnosis not present

## 2023-09-14 DIAGNOSIS — Z87891 Personal history of nicotine dependence: Secondary | ICD-10-CM | POA: Diagnosis not present

## 2023-09-14 DIAGNOSIS — Z122 Encounter for screening for malignant neoplasm of respiratory organs: Secondary | ICD-10-CM | POA: Diagnosis not present

## 2023-09-25 DIAGNOSIS — I517 Cardiomegaly: Secondary | ICD-10-CM | POA: Diagnosis not present

## 2023-09-25 DIAGNOSIS — I158 Other secondary hypertension: Secondary | ICD-10-CM | POA: Diagnosis not present

## 2023-09-25 DIAGNOSIS — I480 Paroxysmal atrial fibrillation: Secondary | ICD-10-CM | POA: Diagnosis not present

## 2023-09-25 DIAGNOSIS — E782 Mixed hyperlipidemia: Secondary | ICD-10-CM | POA: Diagnosis not present

## 2023-09-25 DIAGNOSIS — Z720 Tobacco use: Secondary | ICD-10-CM | POA: Diagnosis not present

## 2023-10-24 DIAGNOSIS — I480 Paroxysmal atrial fibrillation: Secondary | ICD-10-CM | POA: Diagnosis not present

## 2023-10-25 DIAGNOSIS — I493 Ventricular premature depolarization: Secondary | ICD-10-CM | POA: Diagnosis not present

## 2023-10-25 DIAGNOSIS — I4719 Other supraventricular tachycardia: Secondary | ICD-10-CM | POA: Diagnosis not present

## 2023-10-25 DIAGNOSIS — L918 Other hypertrophic disorders of the skin: Secondary | ICD-10-CM | POA: Diagnosis not present

## 2023-10-25 DIAGNOSIS — D485 Neoplasm of uncertain behavior of skin: Secondary | ICD-10-CM | POA: Diagnosis not present

## 2023-10-25 DIAGNOSIS — I480 Paroxysmal atrial fibrillation: Secondary | ICD-10-CM | POA: Diagnosis not present

## 2023-10-25 DIAGNOSIS — L821 Other seborrheic keratosis: Secondary | ICD-10-CM | POA: Diagnosis not present

## 2023-11-12 ENCOUNTER — Other Ambulatory Visit: Payer: Self-pay | Admitting: Physician Assistant

## 2023-11-12 DIAGNOSIS — Z1231 Encounter for screening mammogram for malignant neoplasm of breast: Secondary | ICD-10-CM

## 2023-11-13 ENCOUNTER — Ambulatory Visit

## 2023-11-16 ENCOUNTER — Ambulatory Visit
Admission: RE | Admit: 2023-11-16 | Discharge: 2023-11-16 | Disposition: A | Discharge status: Home / Self Care | Source: Ambulatory Visit | Attending: Physician Assistant | Admitting: Physician Assistant

## 2023-11-16 DIAGNOSIS — Z1231 Encounter for screening mammogram for malignant neoplasm of breast: Secondary | ICD-10-CM

## 2023-11-23 DIAGNOSIS — M8580 Other specified disorders of bone density and structure, unspecified site: Secondary | ICD-10-CM | POA: Diagnosis not present

## 2023-11-23 DIAGNOSIS — L659 Nonscarring hair loss, unspecified: Secondary | ICD-10-CM | POA: Diagnosis not present

## 2023-11-23 DIAGNOSIS — Z9181 History of falling: Secondary | ICD-10-CM | POA: Diagnosis not present

## 2023-11-23 DIAGNOSIS — I4891 Unspecified atrial fibrillation: Secondary | ICD-10-CM | POA: Diagnosis not present

## 2023-11-23 DIAGNOSIS — Z139 Encounter for screening, unspecified: Secondary | ICD-10-CM | POA: Diagnosis not present

## 2023-11-23 DIAGNOSIS — M5416 Radiculopathy, lumbar region: Secondary | ICD-10-CM | POA: Diagnosis not present

## 2023-11-23 DIAGNOSIS — I10 Essential (primary) hypertension: Secondary | ICD-10-CM | POA: Diagnosis not present

## 2023-11-23 DIAGNOSIS — E785 Hyperlipidemia, unspecified: Secondary | ICD-10-CM | POA: Diagnosis not present

## 2023-11-23 DIAGNOSIS — I517 Cardiomegaly: Secondary | ICD-10-CM | POA: Diagnosis not present

## 2023-11-23 DIAGNOSIS — M1712 Unilateral primary osteoarthritis, left knee: Secondary | ICD-10-CM | POA: Diagnosis not present

## 2023-11-23 DIAGNOSIS — R7303 Prediabetes: Secondary | ICD-10-CM | POA: Diagnosis not present

## 2024-02-29 DIAGNOSIS — E785 Hyperlipidemia, unspecified: Secondary | ICD-10-CM | POA: Diagnosis not present

## 2024-02-29 DIAGNOSIS — R7303 Prediabetes: Secondary | ICD-10-CM | POA: Diagnosis not present

## 2024-02-29 DIAGNOSIS — M5416 Radiculopathy, lumbar region: Secondary | ICD-10-CM | POA: Diagnosis not present

## 2024-02-29 DIAGNOSIS — J309 Allergic rhinitis, unspecified: Secondary | ICD-10-CM | POA: Diagnosis not present

## 2024-02-29 DIAGNOSIS — M8580 Other specified disorders of bone density and structure, unspecified site: Secondary | ICD-10-CM | POA: Diagnosis not present

## 2024-02-29 DIAGNOSIS — M1712 Unilateral primary osteoarthritis, left knee: Secondary | ICD-10-CM | POA: Diagnosis not present

## 2024-02-29 DIAGNOSIS — I517 Cardiomegaly: Secondary | ICD-10-CM | POA: Diagnosis not present

## 2024-02-29 DIAGNOSIS — L659 Nonscarring hair loss, unspecified: Secondary | ICD-10-CM | POA: Diagnosis not present

## 2024-02-29 DIAGNOSIS — I10 Essential (primary) hypertension: Secondary | ICD-10-CM | POA: Diagnosis not present

## 2024-02-29 DIAGNOSIS — I4891 Unspecified atrial fibrillation: Secondary | ICD-10-CM | POA: Diagnosis not present

## 2024-03-18 DIAGNOSIS — M25562 Pain in left knee: Secondary | ICD-10-CM | POA: Diagnosis not present

## 2024-03-18 DIAGNOSIS — M25561 Pain in right knee: Secondary | ICD-10-CM | POA: Diagnosis not present

## 2024-03-26 DIAGNOSIS — I4510 Unspecified right bundle-branch block: Secondary | ICD-10-CM | POA: Diagnosis not present

## 2024-03-26 DIAGNOSIS — I422 Other hypertrophic cardiomyopathy: Secondary | ICD-10-CM | POA: Diagnosis not present

## 2024-03-26 DIAGNOSIS — I4891 Unspecified atrial fibrillation: Secondary | ICD-10-CM | POA: Diagnosis not present

## 2024-03-26 DIAGNOSIS — R001 Bradycardia, unspecified: Secondary | ICD-10-CM | POA: Diagnosis not present
